# Patient Record
Sex: Male | Born: 1995 | Race: Black or African American | Hispanic: No | Marital: Single
Health system: Southern US, Community
[De-identification: ages and names within clinical notes are randomized; demographics above are authoritative.]

## PROBLEM LIST (undated history)

## (undated) DIAGNOSIS — F840 Autistic disorder: Secondary | ICD-10-CM

## (undated) DIAGNOSIS — T7840XA Allergy, unspecified, initial encounter: Secondary | ICD-10-CM

## (undated) HISTORY — DX: Allergy, unspecified, initial encounter: T78.40XA

## (undated) HISTORY — PX: ORCHIECTOMY: SHX2116

## (undated) HISTORY — PX: BACK SURGERY: SHX140

## (undated) HISTORY — DX: Autistic disorder: F84.0

---
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
## 2017-01-21 ENCOUNTER — Ambulatory Visit: Payer: Self-pay | Admitting: Internal Medicine

## 2017-07-11 ENCOUNTER — Encounter: Payer: Self-pay | Admitting: Internal Medicine

## 2017-07-11 ENCOUNTER — Other Ambulatory Visit (INDEPENDENT_AMBULATORY_CARE_PROVIDER_SITE_OTHER): Payer: Federal, State, Local not specified - PPO

## 2017-07-11 ENCOUNTER — Ambulatory Visit (INDEPENDENT_AMBULATORY_CARE_PROVIDER_SITE_OTHER): Payer: Federal, State, Local not specified - PPO | Admitting: Internal Medicine

## 2017-07-11 VITALS — BP 124/90 | HR 91 | Temp 97.8°F | Ht 70.0 in | Wt 176.0 lb

## 2017-07-11 DIAGNOSIS — Z91013 Allergy to seafood: Secondary | ICD-10-CM

## 2017-07-11 DIAGNOSIS — Z0000 Encounter for general adult medical examination without abnormal findings: Secondary | ICD-10-CM

## 2017-07-11 DIAGNOSIS — Z23 Encounter for immunization: Secondary | ICD-10-CM | POA: Diagnosis not present

## 2017-07-11 DIAGNOSIS — Z91010 Allergy to peanuts: Secondary | ICD-10-CM

## 2017-07-11 LAB — URINALYSIS, ROUTINE W REFLEX MICROSCOPIC
Bilirubin Urine: NEGATIVE
HGB URINE DIPSTICK: NEGATIVE
KETONES UR: NEGATIVE
Leukocytes, UA: NEGATIVE
NITRITE: NEGATIVE
RBC / HPF: NONE SEEN (ref 0–?)
Specific Gravity, Urine: >=1.03 — AB (ref 1.000–1.030)
Total Protein, Urine: 100 — AB
URINE GLUCOSE: NEGATIVE
UROBILINOGEN UA: 1 (ref 0.0–1.0)
pH: 6 (ref 5.0–8.0)

## 2017-07-11 LAB — CBC WITH DIFFERENTIAL/PLATELET
BASOS PCT: 0.2 % (ref 0.0–3.0)
Basophils Absolute: 0 10*3/uL (ref 0.0–0.1)
EOS PCT: 2.6 % (ref 0.0–5.0)
Eosinophils Absolute: 0.1 10*3/uL (ref 0.0–0.7)
HCT: 50.1 % (ref 39.0–52.0)
Hemoglobin: 16.5 g/dL (ref 13.0–17.0)
LYMPHS ABS: 1.6 10*3/uL (ref 0.7–4.0)
Lymphocytes Relative: 32.7 % (ref 12.0–46.0)
MCHC: 33 g/dL (ref 30.0–36.0)
MCV: 82.5 fl (ref 78.0–100.0)
MONO ABS: 0.4 10*3/uL (ref 0.1–1.0)
Monocytes Relative: 8.3 % (ref 3.0–12.0)
NEUTROS ABS: 2.8 10*3/uL (ref 1.4–7.7)
NEUTROS PCT: 56.2 % (ref 43.0–77.0)
PLATELETS: 256 10*3/uL (ref 150.0–400.0)
RBC: 6.08 Mil/uL — ABNORMAL HIGH (ref 4.22–5.81)
RDW: 13.4 % (ref 11.5–14.6)
WBC: 4.9 10*3/uL (ref 4.5–10.5)

## 2017-07-11 LAB — BASIC METABOLIC PANEL
BUN: 18 mg/dL (ref 6–23)
CALCIUM: 10.1 mg/dL (ref 8.4–10.5)
CHLORIDE: 103 meq/L (ref 96–112)
CO2: 26 meq/L (ref 19–32)
CREATININE: 1.31 mg/dL (ref 0.40–1.50)
GFR: 89.18 mL/min (ref >60.00)
Glucose, Bld: 64 mg/dL — ABNORMAL LOW (ref 70–99)
Potassium: 4.4 mEq/L (ref 3.5–5.1)
SODIUM: 139 meq/L (ref 135–145)

## 2017-07-11 LAB — HEPATIC FUNCTION PANEL
ALK PHOS: 47 U/L (ref 39–117)
ALT: 21 U/L (ref 0–53)
AST: 22 U/L (ref 0–37)
Albumin: 4.5 g/dL (ref 3.5–5.2)
BILIRUBIN DIRECT: 0.1 mg/dL (ref 0.0–0.3)
BILIRUBIN TOTAL: 0.7 mg/dL (ref 0.2–1.2)
TOTAL PROTEIN: 7.2 g/dL (ref 6.0–8.3)

## 2017-07-11 LAB — LIPID PANEL
CHOLESTEROL: 117 mg/dL (ref 0–200)
HDL: 42.6 mg/dL (ref >39.00)
LDL CALC: 58 mg/dL (ref 0–99)
NonHDL: 74.24
Total CHOL/HDL Ratio: 3
Triglycerides: 79 mg/dL (ref 0.0–149.0)
VLDL: 15.8 mg/dL (ref 0.0–40.0)

## 2017-07-11 LAB — TSH: TSH: 0.79 u[IU]/mL (ref 0.35–5.50)

## 2017-07-11 LAB — VITAMIN B12: Vitamin B-12: 760 pg/mL (ref 211–911)

## 2017-07-11 MED ORDER — PSEUDOEPHEDRINE HCL 30 MG PO TABS: 60.0000 mg | ORAL_TABLET | ORAL | 1 refills | Status: DC | PRN

## 2017-07-11 MED ORDER — CETIRIZINE HCL 10 MG PO TABS: 10.0000 mg | ORAL_TABLET | Freq: Every day | ORAL | 11 refills | Status: DC

## 2017-07-11 MED ORDER — B COMPLEX PO TABS: 1.0000 | ORAL_TABLET | Freq: Every day | ORAL | 3 refills | Status: AC

## 2017-07-11 MED ORDER — DIPHENHYDRAMINE HCL 25 MG PO TABS: 50.0000 mg | ORAL_TABLET | Freq: Four times a day (QID) | ORAL | 2 refills | Status: AC | PRN

## 2017-07-11 MED ORDER — EPINEPHRINE 0.3 MG/0.3ML IJ SOAJ: 0.3000 mg | Freq: Once | INTRAMUSCULAR | 2 refills | Status: AC

## 2017-07-11 MED ORDER — VITAMIN D3 50 MCG (2000 UT) PO CAPS: 2000.0000 [IU] | ORAL_CAPSULE | Freq: Every day | ORAL | 3 refills | Status: AC

## 2017-07-11 NOTE — Assessment & Plan Note (Signed)
Epipen Benadryl, Sudafed Zyrtec

## 2017-07-11 NOTE — Assessment & Plan Note (Signed)
We discussed age appropriate health related issues, including available/recomended screening tests and vaccinations. We discussed a need for adhering to healthy diet and exercise. Labs were ordered to be later reviewed . All questions were answered.  Flu shot Pt declined tDAP Not driving Glasses

## 2017-07-11 NOTE — Assessment & Plan Note (Signed)
Epipen Benadryl, Sudafed Zyrtec 

## 2017-07-11 NOTE — Progress Notes (Signed)
Subjective:  Patient ID: David Cook, male    DOB: 08/19/1996  Age: 21 y.o. MRN: [id number]  CC: No chief complaint on file.   HPI David Cook presents for a well exam, new patient.  No outpatient prescriptions prior to visit.   No facility-administered medications prior to visit.     ROS Review of Systems  Constitutional: Negative for appetite change, fatigue and unexpected weight change.  HENT: Negative for congestion, nosebleeds, sneezing, sore throat and trouble swallowing.   Eyes: Negative for itching and visual disturbance.  Respiratory: Negative for cough.   Cardiovascular: Negative for chest pain, palpitations and leg swelling.  Gastrointestinal: Negative for abdominal distention, blood in stool, diarrhea and nausea.  Genitourinary: Negative for frequency and hematuria.  Musculoskeletal: Negative for back pain, gait problem, joint swelling and neck pain.  Skin: Negative for rash.  Neurological: Positive for headaches. Negative for dizziness, tremors, speech difficulty and weakness.  Psychiatric/Behavioral: Positive for decreased concentration. Negative for agitation, dysphoric mood and sleep disturbance. The patient is not nervous/anxious.     Objective:  BP 124/90 (BP Location: Left Arm, Patient Position: Sitting, Cuff Size: Normal)   Pulse 91   Temp 97.8 F (36.6 C) (Oral)   Ht 5\' 10"  (1.778 m)   Wt 176 lb (79.8 kg)   SpO2 98%   BMI 25.25 kg/m   BP Readings from Last 3 Encounters:  07/11/17 124/90    Wt Readings from Last 3 Encounters:  07/11/17 176 lb (79.8 kg)    Physical Exam  Constitutional: He is oriented to person, place, and time. He appears well-developed. No distress.  NAD  HENT:  Mouth/Throat: Oropharynx is clear and moist.  Eyes: Pupils are equal, round, and reactive to light. Conjunctivae are normal.  Neck: Normal range of motion. No JVD present. No thyromegaly present.  Cardiovascular: Normal rate, regular rhythm, normal heart  sounds and intact distal pulses.  Exam reveals no gallop and no friction rub.   No murmur heard. Pulmonary/Chest: Effort normal and breath sounds normal. No respiratory distress. He has no wheezes. He has no rales. He exhibits no tenderness.  Abdominal: Soft. Bowel sounds are normal. He exhibits no distension and no mass. There is no tenderness. There is no rebound and no guarding.  Musculoskeletal: Normal range of motion. He exhibits no edema or tenderness.  Lymphadenopathy:    He has no cervical adenopathy.  Neurological: He is alert and oriented to person, place, and time. He has normal reflexes. No cranial nerve deficit. He exhibits normal muscle tone. He displays a negative Romberg sign. Coordination and gait normal.  Skin: Skin is warm and dry. No rash noted.  Psychiatric: He has a normal mood and affect. His behavior is normal. Judgment and thought content normal.    No results found for: WBC, HGB, HCT, PLT, GLUCOSE, CHOL, TRIG, HDL, LDLDIRECT, LDLCALC, ALT, AST, NA, K, CL, CREATININE, BUN, CO2, TSH, PSA, INR, GLUF, HGBA1C, MICROALBUR  Dg Chest 2 View  Result Date: 02/26/2009 Clinical Data: Shortness of breath, wheezing.  CHEST - 2 VIEW  Comparison: None  Findings: Mild hyperaeration of the lungs.  No focal opacities or effusions.  Heart is normal size.  There is gaseous distention of the stomach.  No acute bony abnormality.  IMPRESSION: Mild hyperaeration.  Gaseous distention of the stomach. Provider: Gaspar Skeetersory Sandling   Assessment & Plan:   There are no diagnoses linked to this encounter. Mr. Marissa CalamityBallard does not currently have medications on file.  No orders  of the defined types were placed in this encounter.    Follow-up: No Follow-up on file.  Walker Kehr, MD

## 2017-07-22 NOTE — Addendum Note (Signed)
Addended by: Scarlett Presto on: 07/22/2017 11:56 AM   Modules accepted: Orders

## 2017-11-22 ENCOUNTER — Encounter: Payer: Federal, State, Local not specified - PPO | Admitting: Internal Medicine

## 2017-11-29 ENCOUNTER — Encounter: Payer: Self-pay | Admitting: Internal Medicine

## 2017-11-29 ENCOUNTER — Ambulatory Visit (INDEPENDENT_AMBULATORY_CARE_PROVIDER_SITE_OTHER): Payer: Federal, State, Local not specified - PPO | Admitting: Internal Medicine

## 2017-11-29 VITALS — BP 126/90 | HR 90 | Temp 98.5°F | Ht 70.0 in | Wt 173.0 lb

## 2017-11-29 DIAGNOSIS — Z0000 Encounter for general adult medical examination without abnormal findings: Secondary | ICD-10-CM

## 2017-11-29 DIAGNOSIS — L309 Dermatitis, unspecified: Secondary | ICD-10-CM | POA: Insufficient documentation

## 2017-11-29 DIAGNOSIS — Z23 Encounter for immunization: Secondary | ICD-10-CM

## 2017-11-29 MED ORDER — TRIAMCINOLONE ACETONIDE 0.1 % EX OINT: 1.0000 "application " | TOPICAL_OINTMENT | Freq: Three times a day (TID) | CUTANEOUS | 2 refills | Status: DC

## 2017-11-29 NOTE — Progress Notes (Signed)
Subjective:  Patient ID: David Cook, male    DOB: 04/09/1996  Age: 22 y.o. MRN: [id number]  CC: No chief complaint on file.   HPI David Cook presents for a well exam  Outpatient Medications Prior to Visit  Medication Sig Dispense Refill  . b complex vitamins tablet Take 1 tablet by mouth daily. 100 tablet 3  . cetirizine (ZYRTEC ALLERGY) 10 MG tablet Take 1 tablet (10 mg total) by mouth daily. 30 tablet 11  . Cholecalciferol (VITAMIN D3) 2000 units capsule Take 1 capsule (2,000 Units total) by mouth daily. 100 capsule 3  . diphenhydrAMINE (BENADRYL) 25 MG tablet Take 2 tablets (50 mg total) by mouth every 6 (six) hours as needed for itching or allergies. 60 tablet 2  . pseudoephedrine (SUDAFED) 30 MG tablet Take 2 tablets (60 mg total) by mouth every 4 (four) hours as needed for congestion. 30 tablet 1   No facility-administered medications prior to visit.     ROS Review of Systems  Constitutional: Negative for appetite change, fatigue and unexpected weight change.  HENT: Negative for congestion, nosebleeds, sneezing, sore throat and trouble swallowing.   Eyes: Negative for itching and visual disturbance.  Respiratory: Negative for cough.   Cardiovascular: Negative for chest pain, palpitations and leg swelling.  Gastrointestinal: Negative for abdominal distention, blood in stool, diarrhea and nausea.  Genitourinary: Negative for frequency, hematuria and testicular pain.  Musculoskeletal: Negative for back pain, gait problem, joint swelling and neck pain.  Skin: Positive for rash.  Neurological: Negative for dizziness, tremors, speech difficulty and weakness.  Psychiatric/Behavioral: Negative for agitation, decreased concentration, dysphoric mood, sleep disturbance and suicidal ideas. The patient is not nervous/anxious.     Objective:  BP 126/90 (BP Location: Left Arm, Patient Position: Sitting, Cuff Size: Large)   Pulse 90   Temp 98.5 F (36.9 C) (Oral)   Ht 5'  10" (1.778 m)   Wt 173 lb (78.5 kg)   SpO2 99%   BMI 24.82 kg/m   BP Readings from Last 3 Encounters:  11/29/17 126/90  07/11/17 124/90    Wt Readings from Last 3 Encounters:  11/29/17 173 lb (78.5 kg)  07/11/17 176 lb (79.8 kg)    Physical Exam  Constitutional: He is oriented to person, place, and time. He appears well-developed. No distress.  NAD  HENT:  Mouth/Throat: Oropharynx is clear and moist.  Eyes: Conjunctivae are normal. Pupils are equal, round, and reactive to light.  Neck: Normal range of motion. No JVD present. No thyromegaly present.  Cardiovascular: Normal rate, regular rhythm, normal heart sounds and intact distal pulses. Exam reveals no gallop and no friction rub.  No murmur heard. Pulmonary/Chest: Effort normal and breath sounds normal. No respiratory distress. He has no wheezes. He has no rales. He exhibits no tenderness.  Abdominal: Soft. Bowel sounds are normal. He exhibits no distension and no mass. There is no tenderness. There is no rebound and no guarding.  Musculoskeletal: Normal range of motion. He exhibits no edema or tenderness.  Lymphadenopathy:    He has no cervical adenopathy.  Neurological: He is alert and oriented to person, place, and time. He has normal reflexes. No cranial nerve deficit. He exhibits normal muscle tone. He displays a negative Romberg sign. Coordination and gait normal.  Skin: Skin is warm and dry. No rash noted.  Psychiatric: He has a normal mood and affect. His behavior is normal. Judgment and thought content normal.  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  CHEST - 2 VIEW  Comparison: None  Findings: Mild hyperaeration of the lungs.  No focal opacities or effusions.  Heart is normal size.  There is gaseous distention of the stomach.  No acute bony abnormality.  IMPRESSION: Mild hyperaeration.  Gaseous distention of the stomach. Provider: Gaspar Cook   Assessment & Plan:   There are no diagnoses linked to this encounter. I am having David Cook. David Cook maintain his cetirizine, Vitamin D3, b complex vitamins, pseudoephedrine, and diphenhydrAMINE.  No orders of the defined types were placed in this encounter.    Follow-up: No Follow-up on file.  David Primes, MD

## 2017-11-29 NOTE — Assessment & Plan Note (Signed)
We discussed age appropriate health related issues, including available/recomended screening tests and vaccinations. We discussed a need for adhering to healthy diet and exercise. Labs were ordered to be later reviewed . All questions were answered.    tDAP given Not driving Glasses

## 2017-11-29 NOTE — Assessment & Plan Note (Signed)
Triamc 0.1% oint prn

## 2018-01-09 ENCOUNTER — Ambulatory Visit: Payer: Federal, State, Local not specified - PPO | Admitting: Internal Medicine

## 2018-01-16 ENCOUNTER — Ambulatory Visit: Payer: Federal, State, Local not specified - PPO | Admitting: Internal Medicine

## 2020-05-15 ENCOUNTER — Encounter: Payer: Self-pay | Admitting: Internal Medicine

## 2020-05-15 ENCOUNTER — Other Ambulatory Visit: Payer: Self-pay

## 2020-05-15 ENCOUNTER — Ambulatory Visit (INDEPENDENT_AMBULATORY_CARE_PROVIDER_SITE_OTHER): Payer: Medicare Other | Admitting: Internal Medicine

## 2020-05-15 VITALS — BP 130/82 | HR 89 | Temp 98.9°F | Ht 70.0 in | Wt 203.0 lb

## 2020-05-15 DIAGNOSIS — L309 Dermatitis, unspecified: Secondary | ICD-10-CM

## 2020-05-15 DIAGNOSIS — R202 Paresthesia of skin: Secondary | ICD-10-CM

## 2020-05-15 DIAGNOSIS — Z0000 Encounter for general adult medical examination without abnormal findings: Secondary | ICD-10-CM

## 2020-05-15 DIAGNOSIS — E559 Vitamin D deficiency, unspecified: Secondary | ICD-10-CM

## 2020-05-15 DIAGNOSIS — Z9079 Acquired absence of other genital organ(s): Secondary | ICD-10-CM | POA: Insufficient documentation

## 2020-05-15 DIAGNOSIS — E785 Hyperlipidemia, unspecified: Secondary | ICD-10-CM

## 2020-05-15 DIAGNOSIS — R030 Elevated blood-pressure reading, without diagnosis of hypertension: Secondary | ICD-10-CM | POA: Insufficient documentation

## 2020-05-15 MED ORDER — TRIAMCINOLONE ACETONIDE 0.1 % EX OINT: 1.0000 "application " | TOPICAL_OINTMENT | Freq: Three times a day (TID) | CUTANEOUS | 2 refills | Status: DC

## 2020-05-15 NOTE — Progress Notes (Signed)
Subjective:  Patient ID: David Cook, male    DOB: August 08, 1996  Age: 24 y.o. MRN: [id number]  CC: No chief complaint on file.   HPI PEDRAM GOODCHILD presents for a well exam  Outpatient Medications Prior to Visit  Medication Sig Dispense Refill  . b complex vitamins tablet Take 1 tablet by mouth daily. 100 tablet 3  . Cholecalciferol (VITAMIN D3) 2000 units capsule Take 1 capsule (2,000 Units total) by mouth daily. 100 capsule 3  . diphenhydrAMINE (BENADRYL) 25 MG tablet Take 2 tablets (50 mg total) by mouth every 6 (six) hours as needed for itching or allergies. 60 tablet 2  . pseudoephedrine (SUDAFED) 30 MG tablet Take 2 tablets (60 mg total) by mouth every 4 (four) hours as needed for congestion. 30 tablet 1  . triamcinolone ointment (KENALOG) 0.1 % Apply 1 application topically 3 (three) times daily. Dry skin rash, cracked lips 80 g 2  . cetirizine (ZYRTEC ALLERGY) 10 MG tablet Take 1 tablet (10 mg total) by mouth daily. 30 tablet 11   No facility-administered medications prior to visit.    ROS: Review of Systems  Constitutional: Negative for appetite change, fatigue and unexpected weight change.  HENT: Negative for congestion, nosebleeds, sneezing, sore throat and trouble swallowing.   Eyes: Negative for itching and visual disturbance.  Respiratory: Negative for cough.   Cardiovascular: Negative for chest pain, palpitations and leg swelling.  Gastrointestinal: Negative for abdominal distention, blood in stool, diarrhea and nausea.  Genitourinary: Negative for frequency and hematuria.  Musculoskeletal: Negative for back pain, gait problem, joint swelling and neck pain.  Skin: Negative for rash.  Neurological: Negative for dizziness, tremors, speech difficulty and weakness.  Psychiatric/Behavioral: Negative for agitation, dysphoric mood and sleep disturbance. The patient is not nervous/anxious.   eczema R testis WNL  Objective:  BP (!) 142/100 (BP Location: Left Arm,  Patient Position: Sitting, Cuff Size: Normal)   Pulse 89   Temp 98.9 F (37.2 C) (Oral)   Ht 5\' 10"  (1.778 m)   Wt 203 lb (92.1 kg)   SpO2 97%   BMI 29.13 kg/m   BP Readings from Last 3 Encounters:  05/15/20 (!) 142/100  11/29/17 126/90  07/11/17 124/90    Wt Readings from Last 3 Encounters:  05/15/20 203 lb (92.1 kg)  11/29/17 173 lb (78.5 kg)  07/11/17 176 lb (79.8 kg)    Physical Exam Constitutional:      General: He is not in acute distress.    Appearance: He is well-developed.     Comments: NAD  Eyes:     Conjunctiva/sclera: Conjunctivae normal.     Pupils: Pupils are equal, round, and reactive to light.  Neck:     Thyroid: No thyromegaly.     Vascular: No JVD.  Cardiovascular:     Rate and Rhythm: Normal rate and regular rhythm.     Heart sounds: Normal heart sounds. No murmur heard.  No friction rub. No gallop.   Pulmonary:     Effort: Pulmonary effort is normal. No respiratory distress.     Breath sounds: Normal breath sounds. No wheezing or rales.  Chest:     Chest wall: No tenderness.  Abdominal:     General: Bowel sounds are normal. There is no distension.     Palpations: Abdomen is soft. There is no mass.     Tenderness: There is no abdominal tenderness. There is no guarding or rebound.  Musculoskeletal:  General: No tenderness. Normal range of motion.     Cervical back: Normal range of motion.  Lymphadenopathy:     Cervical: No cervical adenopathy.  Skin:    General: Skin is warm and dry.     Findings: No rash.  Neurological:     Mental Status: He is alert and oriented to person, place, and time.     Cranial Nerves: No cranial nerve deficit.     Motor: No abnormal muscle tone.     Coordination: Coordination normal.     Gait: Gait normal.     Deep Tendon Reflexes: Reflexes are normal and symmetric.  Psychiatric:        Behavior: Behavior normal.        Thought Content: Thought content normal.        Judgment: Judgment normal.    R  testist WNL Partial wax B ears   Lab Results  Component Value Date   WBC 4.9 07/11/2017   HGB 16.5 07/11/2017   HCT 50.1 07/11/2017   PLT 256.0 07/11/2017   GLUCOSE 64 (L) 07/11/2017   CHOL 117 07/11/2017   TRIG 79.0 07/11/2017   HDL 42.60 07/11/2017   LDLCALC 58 07/11/2017   ALT 21 07/11/2017   AST 22 07/11/2017   NA 139 07/11/2017   K 4.4 07/11/2017   CL 103 07/11/2017   CREATININE 1.31 07/11/2017   BUN 18 07/11/2017   CO2 26 07/11/2017   TSH 0.79 07/11/2017    DG Chest 2 View  Result Date: 02/26/2009 Clinical Data: Shortness of breath, wheezing.  CHEST - 2 VIEW  Comparison: None  Findings: Mild hyperaeration of the lungs.  No focal opacities or effusions.  Heart is normal size.  There is gaseous distention of the stomach.  No acute bony abnormality.  IMPRESSION: Mild hyperaeration.  Gaseous distention of the stomach. Provider: Gaspar Skeeters   Assessment & Plan:   There are no diagnoses linked to this encounter.   No orders of the defined types were placed in this encounter.    Follow-up: No follow-ups on file.  Sonda Primes, MD

## 2020-05-15 NOTE — Assessment & Plan Note (Addendum)
Check BP at home Report if high BP Readings from Last 3 Encounters:  05/15/20 (!) 142/100  11/29/17 126/90  07/11/17 124/90

## 2020-05-15 NOTE — Assessment & Plan Note (Signed)
Kenalog cream prn 

## 2020-05-15 NOTE — Assessment & Plan Note (Addendum)
We discussed age appropriate health related issues, including available/recomended screening tests and vaccinations. We discussed a need for adhering to healthy diet and exercise. Labs were ordered. All questions were answered. Clean ears periodically at home w/ewar rinse kit or come here if worse

## 2020-05-19 ENCOUNTER — Encounter: Payer: Self-pay | Admitting: Internal Medicine

## 2021-01-29 ENCOUNTER — Telehealth: Payer: Self-pay | Admitting: Internal Medicine

## 2021-01-29 NOTE — Telephone Encounter (Signed)
[facility] FYI  Caller states her son is experiencing abdominal pain.  Attempted to call 3 times but no VM set up to leave a message.

## 2021-03-25 ENCOUNTER — Ambulatory Visit
Admission: EM | Admit: 2021-03-25 | Discharge: 2021-03-25 | Discharge status: Against Medical Advice | Payer: Medicare Other

## 2021-03-25 ENCOUNTER — Other Ambulatory Visit: Payer: Self-pay

## 2021-03-25 ENCOUNTER — Emergency Department (HOSPITAL_BASED_OUTPATIENT_CLINIC_OR_DEPARTMENT_OTHER): Payer: Medicare Other

## 2021-03-25 ENCOUNTER — Emergency Department (HOSPITAL_BASED_OUTPATIENT_CLINIC_OR_DEPARTMENT_OTHER)
Admission: EM | Admit: 2021-03-25 | Discharge: 2021-03-25 | Disposition: A | Discharge status: Home / Self Care | Payer: Medicare Other | Attending: Emergency Medicine | Admitting: Emergency Medicine

## 2021-03-25 ENCOUNTER — Encounter (HOSPITAL_BASED_OUTPATIENT_CLINIC_OR_DEPARTMENT_OTHER): Payer: Self-pay

## 2021-03-25 ENCOUNTER — Ambulatory Visit: Payer: Medicare Other | Admitting: Internal Medicine

## 2021-03-25 DIAGNOSIS — R5383 Other fatigue: Secondary | ICD-10-CM | POA: Diagnosis not present

## 2021-03-25 DIAGNOSIS — E860 Dehydration: Secondary | ICD-10-CM | POA: Diagnosis not present

## 2021-03-25 DIAGNOSIS — Z91010 Allergy to peanuts: Secondary | ICD-10-CM | POA: Diagnosis not present

## 2021-03-25 DIAGNOSIS — Z20822 Contact with and (suspected) exposure to covid-19: Secondary | ICD-10-CM | POA: Insufficient documentation

## 2021-03-25 DIAGNOSIS — R630 Anorexia: Secondary | ICD-10-CM | POA: Diagnosis not present

## 2021-03-25 DIAGNOSIS — R1030 Lower abdominal pain, unspecified: Secondary | ICD-10-CM | POA: Insufficient documentation

## 2021-03-25 DIAGNOSIS — R509 Fever, unspecified: Secondary | ICD-10-CM | POA: Insufficient documentation

## 2021-03-25 DIAGNOSIS — R519 Headache, unspecified: Secondary | ICD-10-CM | POA: Diagnosis not present

## 2021-03-25 DIAGNOSIS — B349 Viral infection, unspecified: Secondary | ICD-10-CM

## 2021-03-25 LAB — CBC WITH DIFFERENTIAL/PLATELET
Abs Immature Granulocytes: 0.04 10*3/uL (ref 0.00–0.07)
Basophils Absolute: 0 10*3/uL (ref 0.0–0.1)
Basophils Relative: 1 %
Eosinophils Absolute: 0 10*3/uL (ref 0.0–0.5)
Eosinophils Relative: 0 %
HCT: 47.8 % (ref 39.0–52.0)
Hemoglobin: 15.8 g/dL (ref 13.0–17.0)
Immature Granulocytes: 1 %
Lymphocytes Relative: 28 %
Lymphs Abs: 1.1 10*3/uL (ref 0.7–4.0)
MCH: 26.3 pg (ref 26.0–34.0)
MCHC: 33.1 g/dL (ref 30.0–36.0)
MCV: 79.7 fL — ABNORMAL LOW (ref 80.0–100.0)
Monocytes Absolute: 0.3 10*3/uL (ref 0.1–1.0)
Monocytes Relative: 8 %
Neutro Abs: 2.4 10*3/uL (ref 1.7–7.7)
Neutrophils Relative %: 62 %
Platelets: 172 10*3/uL (ref 150–400)
RBC: 6 MIL/uL — ABNORMAL HIGH (ref 4.22–5.81)
RDW: 13.2 % (ref 11.5–15.5)
Smear Review: NORMAL
WBC: 3.8 10*3/uL — ABNORMAL LOW (ref 4.0–10.5)
nRBC: 0 % (ref 0.0–0.2)

## 2021-03-25 LAB — URINALYSIS, MICROSCOPIC (REFLEX)

## 2021-03-25 LAB — COMPREHENSIVE METABOLIC PANEL
ALT: 27 U/L (ref 0–44)
AST: 29 U/L (ref 15–41)
Albumin: 3.7 g/dL (ref 3.5–5.0)
Alkaline Phosphatase: 50 U/L (ref 38–126)
Anion gap: 8 (ref 5–15)
BUN: 17 mg/dL (ref 6–20)
CO2: 23 mmol/L (ref 22–32)
Calcium: 8.7 mg/dL — ABNORMAL LOW (ref 8.9–10.3)
Chloride: 102 mmol/L (ref 98–111)
Creatinine, Ser: 1.63 mg/dL — ABNORMAL HIGH (ref 0.61–1.24)
GFR, Estimated: 60 mL/min — ABNORMAL LOW (ref >60)
Glucose, Bld: 99 mg/dL (ref 70–99)
Potassium: 3.5 mmol/L (ref 3.5–5.1)
Sodium: 133 mmol/L — ABNORMAL LOW (ref 135–145)
Total Bilirubin: 0.6 mg/dL (ref 0.3–1.2)
Total Protein: 7.3 g/dL (ref 6.5–8.1)

## 2021-03-25 LAB — RESP PANEL BY RT-PCR (FLU A&B, COVID) ARPGX2
Influenza A by PCR: NEGATIVE
Influenza B by PCR: NEGATIVE
SARS Coronavirus 2 by RT PCR: NEGATIVE

## 2021-03-25 LAB — URINALYSIS, ROUTINE W REFLEX MICROSCOPIC
Bilirubin Urine: NEGATIVE
Glucose, UA: NEGATIVE mg/dL
Ketones, ur: 15 mg/dL — AB
Leukocytes,Ua: NEGATIVE
Nitrite: NEGATIVE
Protein, ur: >300 mg/dL — AB
Specific Gravity, Urine: >1.03 — ABNORMAL HIGH (ref 1.005–1.030)
pH: 6 (ref 5.0–8.0)

## 2021-03-25 MED ORDER — ACETAMINOPHEN 325 MG PO TABS
650.0000 mg | ORAL_TABLET | Freq: Once | ORAL | Status: AC
  Administered 2021-03-25: 650 mg via ORAL
  Filled 2021-03-25: qty 2

## 2021-03-25 MED ORDER — KETOROLAC TROMETHAMINE 15 MG/ML IJ SOLN: 15.0000 mg | Freq: Once | INTRAMUSCULAR | Status: DC

## 2021-03-25 MED ORDER — LACTATED RINGERS IV BOLUS
1000.0000 mL | Freq: Once | INTRAVENOUS | Status: AC
  Administered 2021-03-25: 1000 mL via INTRAVENOUS

## 2021-03-25 MED ORDER — SODIUM CHLORIDE 0.9 % IV BOLUS
1000.0000 mL | Freq: Once | INTRAVENOUS | Status: AC
  Administered 2021-03-25: 1000 mL via INTRAVENOUS

## 2021-03-25 MED ORDER — HYDROMORPHONE HCL 1 MG/ML IJ SOLN: 1.0000 mg | Freq: Once | INTRAMUSCULAR | Status: DC | PRN

## 2021-03-25 NOTE — ED Provider Notes (Signed)
[facility] EMERGENCY DEPARTMENT Provider Note   CSN: [id number] Arrival date & time: 03/25/21  1238     History Chief Complaint  Patient presents with  . Fatigue    David RIEGLER is a 25 y.o. male.  HPI Patient presents feeling bad for around 3 days.  History of autism.  Reportedly having fevers.  For the last 3 days has been more sleepy.  Headache.  Also had been complaining of lower abdominal pain.  No nausea or vomiting.  Has had a decreased appetite.  No known sick contacts.  Is vaccinated for COVID.  History also comes from the mother including the patient.  No cough.  No sore throat.  Headache is dull. Was previous premature baby.  Past Medical History:  Diagnosis Date  . Allergy    seafood, peanuts  . Autism     Patient Active Problem List   Diagnosis Date Noted  . 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 Elevated blood pressure reading 05/15/2020  . Eczema 11/29/2017  . Well adult exam 07/11/2017  . Allergic to peanuts 07/11/2017  . 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 BACK SURGERY    . ORCHIECTOMY Left    very remote       Family History  Problem Relation Age of Onset  . Depression Mother   . Heart disease Paternal Grandmother   . Stroke Paternal Grandmother     Social History   Tobacco Use  . Smoking status: Never Smoker  . Smokeless tobacco: Never Used  Substance Use Topics  . Alcohol use: No  . Drug use: No    Home Medications Prior to Admission medications   Medication Sig Start Date End Date Taking? Authorizing Provider  b complex vitamins tablet Take 1 tablet by mouth daily. 07/11/17   Plotnikov, Georgina Quint, MD  cetirizine (ZYRTEC ALLERGY) 10 MG tablet Take 1 tablet (10 mg total) by mouth daily. 07/11/17 07/11/18  Plotnikov, Georgina Quint, MD  Cholecalciferol (VITAMIN D3) 2000 units capsule Take 1 capsule (2,000 Units total) by mouth daily. 07/11/17   Plotnikov, Georgina Quint, MD   diphenhydrAMINE (BENADRYL) 25 MG tablet Take 2 tablets (50 mg total) by mouth every 6 (six) hours as needed for itching or allergies. 07/11/17   Plotnikov, Georgina Quint, MD  pseudoephedrine (SUDAFED) 30 MG tablet Take 2 tablets (60 mg total) by mouth every 4 (four) hours as needed for congestion. 07/11/17   Plotnikov, Georgina Quint, MD  triamcinolone ointment (KENALOG) 0.1 % Apply 1 application topically 3 (three) times daily. Dry skin rash, cracked lips 05/15/20   Plotnikov, Georgina Quint, MD    Allergies    Other  Review of Systems   Review of Systems  Constitutional: Positive for appetite change, fatigue and fever.  HENT: Negative for congestion.   Respiratory: Negative for shortness of breath.   Gastrointestinal: Positive for abdominal pain.  Genitourinary: Negative for dysuria.  Musculoskeletal: Negative for back pain.  Skin: Negative for rash.  Neurological: Positive for headaches.  Psychiatric/Behavioral: Positive for confusion.    Physical Exam Updated Vital Signs BP 139/86 (BP Location: Left Arm)   Pulse 91   Temp (!) 100.9 F (38.3 C) (Oral)   Resp 15   Ht 6' (1.829 m)   Wt 83.9 kg   SpO2 100%   BMI 25.09 kg/m   Physical Exam Vitals and nursing note reviewed.  HENT:     Head: Normocephalic.  Eyes:  General: No scleral icterus.    Pupils: Pupils are equal, round, and reactive to light.  Cardiovascular:     Rate and Rhythm: Normal rate and regular rhythm.  Pulmonary:     Breath sounds: No wheezing or rhonchi.  Chest:     Chest wall: No tenderness.  Abdominal:     Comments: Mild suprapubic tenderness without rebound or guarding.  No hernia palpated.  Musculoskeletal:        General: No tenderness.     Cervical back: Neck supple.  Skin:    General: Skin is warm.     Capillary Refill: Capillary refill takes less than 2 seconds.  Neurological:     Mental Status: He is alert.     Comments: Patient sitting in bed.  Will answer questions.  Able to tell me his head  hurts.  Says his abdomen does not hurt now but had previously hurt.  Moves all extremities.  Still prefers to sit with his eyes closed however.     ED Results / Procedures / Treatments   Labs (all labs ordered are listed, but only abnormal results are displayed) Labs Reviewed  COMPREHENSIVE METABOLIC PANEL - Abnormal; Notable for the following components:      Result Value   Sodium 133 (*)    Creatinine, Ser 1.63 (*)    Calcium 8.7 (*)    GFR, Estimated 60 (*)    All other components within normal limits  CBC WITH DIFFERENTIAL/PLATELET - Abnormal; Notable for the following components:   WBC 3.8 (*)    RBC 6.00 (*)    MCV 79.7 (*)    All other components within normal limits  URINALYSIS, ROUTINE W REFLEX MICROSCOPIC - Abnormal; Notable for the following components:   Specific Gravity, Urine >1.030 (*)    Hgb urine dipstick TRACE (*)    Ketones, ur 15 (*)    Protein, ur >300 (*)    All other components within normal limits  URINALYSIS, MICROSCOPIC (REFLEX) - Abnormal; Notable for the following components:   Bacteria, UA FEW (*)    All other components within normal limits  RESP PANEL BY RT-PCR (FLU A&B, COVID) [id number]    EKG None  Radiology DG Chest Portable 1 View  Result Date: 03/25/2021 CLINICAL DATA:  Fever EXAM: PORTABLE CHEST 1 VIEW COMPARISON:  02/26/2009 FINDINGS: The heart size and mediastinal contours are within normal limits. Both lungs are clear. The visualized skeletal structures are unremarkable. IMPRESSION: No acute abnormality of the lungs in AP portable projection. Electronically Signed   By: Lauralyn Primes M.D.   On: 03/25/2021 14:00    Procedures Procedures   Medications Ordered in ED Medications  sodium chloride 0.9 % bolus 1,000 mL (1,000 mLs Intravenous New Bag/Given 03/25/21 1442)  acetaminophen (TYLENOL) tablet 650 mg (650 mg Oral Given 03/25/21 1442)    ED Course  I have reviewed the triage vital signs and the nursing notes.  Pertinent labs &  imaging results that were available during my care of the patient were reviewed by me and considered in my medical decision making (see chart for details).    MDM Rules/Calculators/A&P                          Patient with fever and fatigue.  No known sick contacts.  Has slightly low white count.  Creatinine mildly elevated at 1.6 but appears the last 1 I can see was around 4 years ago and was  1.3.  Chest x-ray reassuring.  Negative COVID and flu testing.  Will give fluid bolus.  Urine shows some dehydration but not clear infection.  Will get CT of the head and abdomen pelvis since he did have the abdominal pain.  I think not likely meningitis at this time.  Care will be turned over to Dr. Particia Nearing Final Clinical Impression(s) / ED Diagnoses Final diagnoses:  Fever, unspecified fever cause    Rx / DC Orders ED Discharge Orders    None       Benjiman Core, MD 03/25/21 [id number]

## 2021-03-25 NOTE — ED Notes (Signed)
ED Provider at bedside. 

## 2021-03-25 NOTE — Discharge Instructions (Addendum)
You are seen in the ER for fatigue, low-grade fever.  Work-up in the ER does not reveal any evidence of bladder infection, pneumonia, COVID-19 or flu.  CT scan of the head and abdomen and pelvis are also reassuring.  We suspect that you might have a viral illness.  Please take Tylenol for fever control and hydrate well.  Return to the ER if your symptoms get worse.

## 2021-03-25 NOTE — ED Triage Notes (Addendum)
Per mother/pt has autism-pt is verbal and answers my questions appropriately-pt with lethargy, sleeping a lot, HA x 3 days-pt to triage in w/c-NAD

## 2021-05-21 ENCOUNTER — Ambulatory Visit: Payer: Medicare Other | Admitting: Internal Medicine

## 2022-04-05 ENCOUNTER — Other Ambulatory Visit: Payer: Self-pay

## 2022-04-05 ENCOUNTER — Emergency Department (HOSPITAL_BASED_OUTPATIENT_CLINIC_OR_DEPARTMENT_OTHER)
Admission: EM | Admit: 2022-04-05 | Discharge: 2022-04-05 | Disposition: A | Discharge status: Home / Self Care | Payer: Medicare Other | Attending: Student | Admitting: Student

## 2022-04-05 ENCOUNTER — Encounter (HOSPITAL_BASED_OUTPATIENT_CLINIC_OR_DEPARTMENT_OTHER): Payer: Self-pay | Admitting: *Deleted

## 2022-04-05 DIAGNOSIS — T7840XA Allergy, unspecified, initial encounter: Secondary | ICD-10-CM | POA: Diagnosis not present

## 2022-04-05 DIAGNOSIS — R1084 Generalized abdominal pain: Secondary | ICD-10-CM | POA: Insufficient documentation

## 2022-04-05 DIAGNOSIS — F840 Autistic disorder: Secondary | ICD-10-CM | POA: Diagnosis not present

## 2022-04-05 DIAGNOSIS — R220 Localized swelling, mass and lump, head: Secondary | ICD-10-CM | POA: Diagnosis not present

## 2022-04-05 DIAGNOSIS — Z91010 Allergy to peanuts: Secondary | ICD-10-CM | POA: Diagnosis not present

## 2022-04-05 MED ORDER — EPINEPHRINE 0.3 MG/0.3ML IJ SOAJ: 0.3000 mg | INTRAMUSCULAR | 0 refills | Status: DC | PRN

## 2022-04-05 MED ORDER — EPINEPHRINE 0.3 MG/0.3ML IJ SOAJ
0.3000 mg | Freq: Once | INTRAMUSCULAR | Status: AC
  Administered 2022-04-05: 0.3 mg via INTRAMUSCULAR
  Filled 2022-04-05: qty 0.3

## 2022-04-05 NOTE — ED Triage Notes (Signed)
Ate pasta/ noodle around 9am, mother had scallops in the pasta, per mother states her sone is allergic to shell fish and peanuts. At approx 10am had vomiting episode, and some lip swelling, mother gave 2 Benadryl PO, pt states his throat feels tight and itching

## 2022-04-05 NOTE — ED Provider Notes (Signed)
Blood pressure 126/79, pulse 86, temperature 98.6 F (37 C), resp. rate 14, height 6' (1.829 m), weight 93.4 kg, SpO2 98 %.  Assuming care from Dr. Posey Rea.  In short, David Cook is a 26 y.o. male with a chief complaint of Allergic Reaction .  Refer to the original H&P for additional details.  The current plan of care is to f/u after ED observation time.  05:35 PM  Patient reevaluated and looking well.  Swelling significantly decreased per mom at bedside.  Will refill be prescription for EpiPen. Discussed strict ED return precautions.     Maia Plan, MD 04/05/22 [phone]

## 2022-04-05 NOTE — Discharge Instructions (Signed)

## 2022-04-05 NOTE — ED Provider Notes (Signed)
[facility] EMERGENCY DEPARTMENT Provider Note  CSN: [id number] Arrival date & time: 04/05/22 1341  Chief Complaint(s) Allergic Reaction  HPI David Cook is a 26 y.o. male with PMH autism, seafood allergy who presents the emergency department for evaluation of an allergic reaction.  History obtained from patient's mother and patient who states that he had Posta that was cooked in a similar pan with scallops.  The patient is complaining of a "scratchy throat", swollen lips and abdominal pain.  Mother administered 50 of Benadryl prior to arrival.  She states that the patient's EpiPen's are expired and thus she did not administer these.  On arrival, patient is speaking in full sentences with no appreciable respiratory distress, no visible rash.   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 Authorizing Provider  b complex vitamins tablet Take 1 tablet by mouth daily. 07/11/17   Plotnikov, Georgina Quint, MD  cetirizine (ZYRTEC ALLERGY) 10 MG tablet Take 1 tablet (10 mg total) by mouth daily. 07/11/17 07/11/18  Plotnikov, Georgina Quint, MD  Cholecalciferol (VITAMIN D3) 2000 units capsule Take 1 capsule (2,000 Units total) by mouth daily. 07/11/17   Plotnikov, Georgina Quint, MD  diphenhydrAMINE (BENADRYL) 25 MG tablet Take 2 tablets (50 mg total) by mouth every 6 (six) hours as needed for itching or allergies. 07/11/17   Plotnikov, Georgina Quint, MD  pseudoephedrine (SUDAFED) 30 MG tablet Take 2 tablets (60 mg total) by mouth every 4 (four) hours as needed for congestion. 07/11/17   Plotnikov, Georgina Quint, MD   triamcinolone ointment (KENALOG) 0.1 % Apply 1 application topically 3 (three) times daily. Dry skin rash, cracked lips 05/15/20   Plotnikov, Georgina Quint, MD                                                                                                                                    Past Surgical History Past Surgical History:  Procedure Laterality Date   BACK SURGERY     ORCHIECTOMY Left    very remote   Family History Family History  Problem Relation Age of Onset   Depression Mother    Heart disease Paternal Grandmother    Stroke Paternal Grandmother     Social History Social History   Tobacco Use   Smoking status: Never   Smokeless tobacco: Never  Substance Use Topics   Alcohol use: No   Drug use: No   Allergies Other  Review of Systems Review of Systems  HENT:  Positive  for facial swelling.   Gastrointestinal:  Positive for abdominal pain.   Physical Exam Vital Signs  I have reviewed the triage vital signs BP 126/79   Pulse 86   Temp 98.6 F (37 C)   Resp 14   Ht 6' (1.829 m)   Wt 93.4 kg   SpO2 98%   BMI 27.94 kg/m   Physical Exam Constitutional:      General: He is not in acute distress.    Appearance: Normal appearance.  HENT:     Head: Normocephalic and atraumatic.     Comments: Mild lip swelling, no appreciable oropharyngeal swelling    Nose: No congestion or rhinorrhea.  Eyes:     General:        Right eye: No discharge.        Left eye: No discharge.     Extraocular Movements: Extraocular movements intact.     Pupils: Pupils are equal, round, and reactive to light.  Cardiovascular:     Rate and Rhythm: Normal rate and regular rhythm.     Heart sounds: No murmur heard. Pulmonary:     Effort: No respiratory distress.     Breath sounds: No wheezing or rales.  Abdominal:     General: There is no distension.     Tenderness: There is abdominal tenderness.  Musculoskeletal:        General: Normal range of motion.     Cervical back:  Normal range of motion.  Skin:    General: Skin is warm and dry.  Neurological:     General: No focal deficit present.     Mental Status: He is alert.    ED Results and Treatments Labs (all labs ordered are listed, but only abnormal results are displayed) Labs Reviewed - No data to display                                                                                                                        Radiology No results found.  Pertinent labs & imaging results that were available during my care of the patient were reviewed by me and considered in my medical decision making (see MDM for details).  Medications Ordered in ED Medications  EPINEPHrine (EPI-PEN) injection 0.3 mg (0.3 mg Intramuscular Given 04/05/22 1433)  Procedures .Critical Care Performed by: Glendora Score, MD Authorized by: Glendora Score, MD   Critical care provider statement:    Critical care time (minutes):  30   Critical care was necessary to treat or prevent imminent or life-threatening deterioration of the following conditions: anaphylaxis.   Critical care was time spent personally by me on the following activities:  Development of treatment plan with patient or surrogate, discussions with consultants, evaluation of patient's response to treatment, examination of patient, ordering and review of laboratory studies, ordering and review of radiographic studies, ordering and performing treatments and interventions, pulse oximetry, re-evaluation of patient's condition and review of old charts  (including critical care time)  Medical Decision Making / ED Course   This patient presents to the ED for concern of allergic reaction, this involves an extensive number of treatment options, and is a complaint that carries with it a high risk of complications and morbidity.  The  differential diagnosis includes allergic reaction, anaphylaxis, aspiration  MDM: Patient seen in the emergency room for evaluation of allergic reaction.  Physical exam reveals lip swelling and mild generalized abdominal tenderness but no appreciable urticaria or oropharyngeal swelling.  However, given multisystem involvement and persistent symptoms despite 50 Benadryl, patient was given an EpiPen 0.3 mg here in the emergency department.  On reevaluation, symptoms are improving but patient will require a 3-hour observation here in the emergency department.  Patient will be safe for discharge at 5:30 PM.  Patient then signed out to oncoming provider.  Please see provider signout for continuation of work-up.  Anticipate discharge home   Additional history obtained: -Additional history obtained from mother -External records from outside source obtained and reviewed including: Chart review including previous notes, labs, imaging, consultation notes  Medicines ordered and prescription drug management: Meds ordered this encounter  Medications   EPINEPHrine (EPI-PEN) injection 0.3 mg    -I have reviewed the patients home medicines and have made adjustments as needed  Critical interventions Epipen administration     Cardiac Monitoring: The patient was maintained on a cardiac monitor.  I personally viewed and interpreted the cardiac monitored which showed an underlying rhythm of: NSR  Social Determinants of Health:  Factors impacting patients care include: none   Reevaluation: After the interventions noted above, I reevaluated the patient and found that they have :improved  Co morbidities that complicate the patient evaluation  Past Medical History:  Diagnosis Date   Allergy    seafood, peanuts   Autism       Dispostion: I considered admission for this patient, and disposition will be pending reevaluation after 3-hour observation.  Anticipate discharge.     Final Clinical  Impression(s) / ED Diagnoses Final diagnoses:  None     @PCDICTATION @    , MD 04/05/22 1605

## 2022-04-05 NOTE — ED Notes (Signed)
No body rash is currently noted, speech wnl, able to swallow without difficulty, controls secretions. Tracheal sounds clear, BS clear bilaterally, resp status WNL

## 2022-04-15 ENCOUNTER — Ambulatory Visit (INDEPENDENT_AMBULATORY_CARE_PROVIDER_SITE_OTHER): Payer: Medicare Other | Admitting: Internal Medicine

## 2022-04-15 ENCOUNTER — Encounter: Payer: Self-pay | Admitting: Internal Medicine

## 2022-04-15 VITALS — BP 128/98 | HR 85 | Temp 97.9°F | Ht 72.0 in | Wt 211.0 lb

## 2022-04-15 DIAGNOSIS — Z91010 Allergy to peanuts: Secondary | ICD-10-CM | POA: Diagnosis not present

## 2022-04-15 DIAGNOSIS — E785 Hyperlipidemia, unspecified: Secondary | ICD-10-CM

## 2022-04-15 DIAGNOSIS — Z0000 Encounter for general adult medical examination without abnormal findings: Secondary | ICD-10-CM

## 2022-04-15 DIAGNOSIS — Z91013 Allergy to seafood: Secondary | ICD-10-CM

## 2022-04-15 DIAGNOSIS — J301 Allergic rhinitis due to pollen: Secondary | ICD-10-CM

## 2022-04-15 DIAGNOSIS — L309 Dermatitis, unspecified: Secondary | ICD-10-CM

## 2022-04-15 DIAGNOSIS — J309 Allergic rhinitis, unspecified: Secondary | ICD-10-CM | POA: Insufficient documentation

## 2022-04-15 DIAGNOSIS — R030 Elevated blood-pressure reading, without diagnosis of hypertension: Secondary | ICD-10-CM

## 2022-04-15 MED ORDER — CLARITIN-D 24 HOUR 10-240 MG PO TB24: 1.0000 | ORAL_TABLET | Freq: Every day | ORAL | 5 refills | Status: AC

## 2022-04-15 MED ORDER — TRIAMCINOLONE ACETONIDE 0.1 % EX OINT: 1.0000 | TOPICAL_OINTMENT | Freq: Three times a day (TID) | CUTANEOUS | 2 refills | Status: DC

## 2022-04-15 MED ORDER — PREDNISONE 20 MG PO TABS: 40.0000 mg | ORAL_TABLET | Freq: Every day | ORAL | 1 refills | Status: DC | PRN

## 2022-04-15 NOTE — Assessment & Plan Note (Signed)
We discussed age appropriate health related issues, including available/recomended screening tests and vaccinations. We discussed a need for adhering to healthy diet and exercise. Labs were ordered. All questions were answered. 

## 2022-04-15 NOTE — Progress Notes (Signed)
Subjective:  Patient ID: David Cook, male    DOB: Oct 23, 1996  Age: 26 y.o. MRN: [id number]  CC: No chief complaint on file.   HPI David Cook presents for a well exam C/o food allergies Playing games - Supersmash  Outpatient Medications Prior to Visit  Medication Sig Dispense Refill  . b complex vitamins tablet Take 1 tablet by mouth daily. 100 tablet 3  . Cholecalciferol (VITAMIN D3) 2000 units capsule Take 1 capsule (2,000 Units total) by mouth daily. 100 capsule 3  . diphenhydrAMINE (BENADRYL) 25 MG tablet Take 2 tablets (50 mg total) by mouth every 6 (six) hours as needed for itching or allergies. 60 tablet 2  . EPINEPHrine 0.3 mg/0.3 mL IJ SOAJ injection Inject 0.3 mg into the muscle as needed for anaphylaxis. 1 each 0  . pseudoephedrine (SUDAFED) 30 MG tablet Take 2 tablets (60 mg total) by mouth every 4 (four) hours as needed for congestion. 30 tablet 1  . triamcinolone ointment (KENALOG) 0.1 % Apply 1 application topically 3 (three) times daily. Dry skin rash, cracked lips 80 g 2  . cetirizine (ZYRTEC ALLERGY) 10 MG tablet Take 1 tablet (10 mg total) by mouth daily. 30 tablet 11   No facility-administered medications prior to visit.    ROS: Review of Systems  Constitutional:  Negative for appetite change, fatigue and unexpected weight change.  HENT:  Positive for congestion and rhinorrhea. Negative for nosebleeds, sneezing, sore throat and trouble swallowing.   Eyes:  Negative for itching and visual disturbance.  Respiratory:  Negative for cough.   Cardiovascular:  Negative for chest pain, palpitations and leg swelling.  Gastrointestinal:  Negative for abdominal distention, blood in stool, diarrhea and nausea.  Genitourinary:  Negative for frequency and hematuria.  Musculoskeletal:  Negative for back pain, gait problem, joint swelling and neck pain.  Skin:  Positive for rash.  Neurological:  Negative for dizziness, tremors, speech difficulty and weakness.   Psychiatric/Behavioral:  Negative for agitation, dysphoric mood and sleep disturbance. The patient is not nervous/anxious.     Objective:  BP (!) 128/98 (BP Location: Left Arm, Patient Position: Sitting, Cuff Size: Normal)   Pulse 85   Temp 97.9 F (36.6 C) (Oral)   Ht 6' (1.829 m)   Wt 211 lb (95.7 kg)   SpO2 97%   BMI 28.62 kg/m   BP Readings from Last 3 Encounters:  04/15/22 (!) 128/98  04/05/22 (!) 147/99  03/25/21 (!) 143/90    Wt Readings from Last 3 Encounters:  04/15/22 211 lb (95.7 kg)  04/05/22 206 lb (93.4 kg)  03/25/21 185 lb (83.9 kg)    Physical Exam Constitutional:      General: He is not in acute distress.    Appearance: He is well-developed.     Comments: NAD  Eyes:     Conjunctiva/sclera: Conjunctivae normal.     Pupils: Pupils are equal, round, and reactive to light.  Neck:     Thyroid: No thyromegaly.     Vascular: No JVD.  Cardiovascular:     Rate and Rhythm: Normal rate and regular rhythm.     Heart sounds: Normal heart sounds. No murmur heard.    No friction rub. No gallop.  Pulmonary:     Effort: Pulmonary effort is normal. No respiratory distress.     Breath sounds: Normal breath sounds. No wheezing or rales.  Chest:     Chest wall: No tenderness.  Abdominal:     General: Bowel sounds  are normal. There is no distension.     Palpations: Abdomen is soft. There is no mass.     Tenderness: There is no abdominal tenderness. There is no guarding or rebound.  Musculoskeletal:        General: No tenderness. Normal range of motion.     Cervical back: Normal range of motion.  Lymphadenopathy:     Cervical: No cervical adenopathy.  Skin:    General: Skin is warm and dry.     Findings: No rash.  Neurological:     Mental Status: He is alert and oriented to person, place, and time.     Cranial Nerves: No cranial nerve deficit.     Motor: No abnormal muscle tone.     Coordination: Coordination normal.     Gait: Gait normal.     Deep Tendon  Reflexes: Reflexes are normal and symmetric.  Psychiatric:        Behavior: Behavior normal.        Thought Content: Thought content normal.        Judgment: Judgment normal.    Lab Results  Component Value Date   WBC 3.8 (L) 03/25/2021   HGB 15.8 03/25/2021   HCT 47.8 03/25/2021   PLT 172 03/25/2021   GLUCOSE 99 03/25/2021   CHOL 117 07/11/2017   TRIG 79.0 07/11/2017   HDL 42.60 07/11/2017   LDLCALC 58 07/11/2017   ALT 27 03/25/2021   AST 29 03/25/2021   NA 133 (L) 03/25/2021   K 3.5 03/25/2021   CL 102 03/25/2021   CREATININE 1.63 (H) 03/25/2021   BUN 17 03/25/2021   CO2 23 03/25/2021   TSH 0.79 07/11/2017    No results found.  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    Epipen, Benadryl, Sudafed prn Cont on Claritin D 24 h daily Allergy ref      Relevant Orders   Ambulatory referral to Allergy   TSH   Urinalysis   CBC with Differential/Platelet   Lipid panel   Comprehensive metabolic panel   Eczema    Kenalog cream prn      Relevant Orders   Ambulatory referral to Allergy   TSH   Urinalysis   CBC with Differential/Platelet   Lipid panel   Comprehensive metabolic panel   Elevated BP without diagnosis of hypertension    Cut back on salt - NAS diet Consider low dose Losartan      Well adult exam - Primary    We discussed age appropriate health related issues, including available/recomended screening tests and vaccinations. We discussed a need for adhering to healthy diet and exercise. Labs were ordered. All questions were answered.       Relevant Orders   TSH   Urinalysis   CBC with Differential/Platelet   Lipid panel   Comprehensive metabolic  panel   Other Visit Diagnoses     Dyslipidemia       Relevant Orders   TSH   Lipid panel         Meds ordered this encounter  Medications  . triamcinolone ointment (  KENALOG) 0.1 %    Sig: Apply 1 Application topically 3 (three) times daily. Dry skin rash, cracked lips    Dispense:  80 g    Refill:  2  . predniSONE (DELTASONE) 20 MG tablet    Sig: Take 2 tablets (40 mg total) by mouth daily as needed.    Dispense:  10 tablet    Refill:  1  . loratadine-pseudoephedrine (CLARITIN-D 24 HOUR) 10-240 MG 24 hr tablet    Sig: Take 1 tablet by mouth daily.    Dispense:  30 tablet    Refill:  5      Follow-up: Return in about 3 months (around 07/16/2022) for a follow-up visit.  Sonda Primes, MD

## 2022-04-15 NOTE — Assessment & Plan Note (Signed)
Kenalog cream prn 

## 2022-04-15 NOTE — Assessment & Plan Note (Addendum)
For an allergic reaction use Epipen, Benadryl, Prednisone 40 mg prn Cont on Claritin D 24 h daily Allergy ref

## 2022-04-15 NOTE — Assessment & Plan Note (Signed)
On Claritin D 24 h Allergy ref

## 2022-04-15 NOTE — Patient Instructions (Signed)
For an allergic reaction use Epipen, Benadryl, Prednisone 40 mg as needed

## 2022-04-15 NOTE — Assessment & Plan Note (Addendum)
Epipen, Benadryl, Sudafed prn Cont on Claritin D 24 h daily Allergy ref

## 2022-04-15 NOTE — Assessment & Plan Note (Signed)
Cut back on salt - NAS diet Consider low dose Losartan

## 2022-08-24 ENCOUNTER — Ambulatory Visit: Payer: Medicare Other | Admitting: Internal Medicine

## 2022-09-08 ENCOUNTER — Encounter: Payer: Self-pay | Admitting: Internal Medicine

## 2022-09-08 ENCOUNTER — Ambulatory Visit (INDEPENDENT_AMBULATORY_CARE_PROVIDER_SITE_OTHER): Payer: Medicare Other | Admitting: Internal Medicine

## 2022-09-08 VITALS — BP 120/86 | HR 81 | Temp 98.0°F | Ht 72.0 in | Wt 209.8 lb

## 2022-09-08 DIAGNOSIS — R151 Fecal smearing: Secondary | ICD-10-CM | POA: Diagnosis not present

## 2022-09-08 DIAGNOSIS — L290 Pruritus ani: Secondary | ICD-10-CM | POA: Diagnosis not present

## 2022-09-08 DIAGNOSIS — R159 Full incontinence of feces: Secondary | ICD-10-CM | POA: Insufficient documentation

## 2022-09-08 MED ORDER — TRIAMCINOLONE ACETONIDE 0.1 % EX OINT: 1.0000 | TOPICAL_OINTMENT | Freq: Two times a day (BID) | CUTANEOUS | 2 refills | Status: DC

## 2022-09-08 MED ORDER — LACTAID 3000 UNITS PO TABS: 6000.0000 [IU] | ORAL_TABLET | Freq: Three times a day (TID) | ORAL | 3 refills | Status: AC | PRN

## 2022-09-08 NOTE — Assessment & Plan Note (Signed)
Stool leakage - mild ?lactose intolerance Cut back on milk products, pizzas

## 2022-09-08 NOTE — Progress Notes (Signed)
Subjective:  Patient ID: David Cook, male    DOB: 1996/03/08  Age: 26 y.o. MRN: [id number]  CC: Rash (Possible jock itch)   HPI David Cook presents for itching in the rear since   Outpatient Medications Prior to Visit  Medication Sig Dispense Refill   b complex vitamins tablet Take 1 tablet by mouth daily. 100 tablet 3   Cholecalciferol (VITAMIN D3) 2000 units capsule Take 1 capsule (2,000 Units total) by mouth daily. 100 capsule 3   diphenhydrAMINE (BENADRYL) 25 MG tablet Take 2 tablets (50 mg total) by mouth every 6 (six) hours as needed for itching or allergies. 60 tablet 2   EPINEPHrine 0.3 mg/0.3 mL IJ SOAJ injection Inject 0.3 mg into the muscle as needed for anaphylaxis. 1 each 0   loratadine-pseudoephedrine (CLARITIN-D 24 HOUR) 10-240 MG 24 hr tablet Take 1 tablet by mouth daily. 30 tablet 5   predniSONE (DELTASONE) 20 MG tablet Take 2 tablets (40 mg total) by mouth daily as needed. 10 tablet 1   triamcinolone ointment (KENALOG) 0.1 % Apply 1 Application topically 3 (three) times daily. Dry skin rash, cracked lips 80 g 2   No facility-administered medications prior to visit.    ROS: Review of Systems  Constitutional:  Negative for appetite change, fatigue and unexpected weight change.  HENT:  Negative for congestion, nosebleeds, sneezing, sore throat and trouble swallowing.   Eyes:  Negative for itching and visual disturbance.  Respiratory:  Negative for cough.   Cardiovascular:  Negative for chest pain, palpitations and leg swelling.  Gastrointestinal:  Positive for diarrhea. Negative for abdominal distention, abdominal pain, anal bleeding, blood in stool, nausea, rectal pain and vomiting.  Genitourinary:  Negative for frequency and hematuria.  Musculoskeletal:  Negative for back pain, gait problem, joint swelling and neck pain.  Skin:  Negative for rash.  Neurological:  Negative for dizziness, tremors, speech difficulty and weakness.  Psychiatric/Behavioral:   Negative for agitation, dysphoric mood and sleep disturbance. The patient is not nervous/anxious.     Objective:  BP 120/86 (BP Location: Left Arm)   Pulse 81   Temp 98 F (36.7 C) (Oral)   Ht 6' (1.829 m)   Wt 209 lb 12.8 oz (95.2 kg)   SpO2 97%   BMI 28.45 kg/m   BP Readings from Last 3 Encounters:  09/08/22 120/86  04/15/22 (!) 128/98  04/05/22 (!) 147/99    Wt Readings from Last 3 Encounters:  09/08/22 209 lb 12.8 oz (95.2 kg)  04/15/22 211 lb (95.7 kg)  04/05/22 206 lb (93.4 kg)    Physical Exam Constitutional:      General: He is not in acute distress.    Appearance: He is well-developed. He is obese.     Comments: NAD  Eyes:     Conjunctiva/sclera: Conjunctivae normal.     Pupils: Pupils are equal, round, and reactive to light.  Neck:     Thyroid: No thyromegaly.     Vascular: No JVD.  Cardiovascular:     Rate and Rhythm: Normal rate and regular rhythm.     Heart sounds: Normal heart sounds. No murmur heard.    No friction rub. No gallop.  Pulmonary:     Effort: Pulmonary effort is normal. No respiratory distress.     Breath sounds: Normal breath sounds. No wheezing or rales.  Chest:     Chest wall: No tenderness.  Abdominal:     General: Bowel sounds are normal. There is no distension.  Palpations: Abdomen is soft. There is no mass.     Tenderness: There is no abdominal tenderness. There is no guarding or rebound.  Musculoskeletal:        General: No tenderness. Normal range of motion.     Cervical back: Normal range of motion.  Lymphadenopathy:     Cervical: No cervical adenopathy.  Skin:    General: Skin is warm and dry.     Findings: No rash.  Neurological:     Mental Status: He is alert and oriented to person, place, and time.     Cranial Nerves: No cranial nerve deficit.     Motor: No abnormal muscle tone.     Coordination: Coordination normal.     Gait: Gait normal.     Deep Tendon Reflexes: Reflexes are normal and symmetric.   Psychiatric:        Behavior: Behavior normal.        Thought Content: Thought content normal.        Judgment: Judgment normal.   Anal area w/loose stool smears   Lab Results  Component Value Date   WBC 3.8 (L) 03/25/2021   HGB 15.8 03/25/2021   HCT 47.8 03/25/2021   PLT 172 03/25/2021   GLUCOSE 99 03/25/2021   CHOL 117 07/11/2017   TRIG 79.0 07/11/2017   HDL 42.60 07/11/2017   LDLCALC 58 07/11/2017   ALT 27 03/25/2021   AST 29 03/25/2021   NA 133 (L) 03/25/2021   K 3.5 03/25/2021   CL 102 03/25/2021   CREATININE 1.63 (H) 03/25/2021   BUN 17 03/25/2021   CO2 23 03/25/2021   TSH 0.79 07/11/2017    No results found.  Assessment & Plan:   Problem List Items Addressed This Visit     Anal itching - Primary    Hygiene discussed Wash after BMs and pat dry Triamcinolone cream bid prn      Stool incontinence    Stool leakage - mild ?lactose intolerance Cut back on milk products, pizzas         Meds ordered this encounter  Medications   lactase (LACTAID) 3000 units tablet    Sig: Take 2 tablets (6,000 Units total) by mouth 3 (three) times daily with meals as needed (when eating cheese, pizza).    Dispense:  120 tablet    Refill:  3   triamcinolone ointment (KENALOG) 0.1 %    Sig: Apply 1 Application topically 2 (two) times daily. Dry skin rash, cracked lips    Dispense:  80 g    Refill:  2      Follow-up: No follow-ups on file.  Sonda Primes, MD

## 2022-09-08 NOTE — Patient Instructions (Addendum)
  Cut back on milk products, pizzas  Wash after BMs and pat dry

## 2022-09-08 NOTE — Assessment & Plan Note (Signed)
Hygiene discussed Wash after BMs and pat dry Triamcinolone cream bid prn

## 2022-12-29 ENCOUNTER — Ambulatory Visit: Payer: 59

## 2023-05-19 ENCOUNTER — Ambulatory Visit: Payer: 59 | Admitting: Internal Medicine

## 2023-05-25 ENCOUNTER — Encounter: Payer: Self-pay | Admitting: Internal Medicine

## 2023-05-25 ENCOUNTER — Ambulatory Visit (INDEPENDENT_AMBULATORY_CARE_PROVIDER_SITE_OTHER): Payer: 59 | Admitting: Internal Medicine

## 2023-05-25 VITALS — BP 130/80 | HR 102 | Temp 98.9°F | Ht 72.0 in | Wt 203.0 lb

## 2023-05-25 DIAGNOSIS — Z91013 Allergy to seafood: Secondary | ICD-10-CM

## 2023-05-25 DIAGNOSIS — H6120 Impacted cerumen, unspecified ear: Secondary | ICD-10-CM

## 2023-05-25 DIAGNOSIS — L309 Dermatitis, unspecified: Secondary | ICD-10-CM | POA: Diagnosis not present

## 2023-05-25 DIAGNOSIS — Z91010 Allergy to peanuts: Secondary | ICD-10-CM

## 2023-05-25 MED ORDER — CLOBETASOL PROPIONATE 0.05 % EX SOLN: 1.0000 | Freq: Two times a day (BID) | CUTANEOUS | 3 refills | Status: DC

## 2023-05-25 MED ORDER — CLOBETASOL PROPIONATE 0.05 % EX CREA: 1.0000 | TOPICAL_CREAM | Freq: Two times a day (BID) | CUTANEOUS | 2 refills | Status: DC

## 2023-05-25 NOTE — Assessment & Plan Note (Signed)
Use Debrox kit

## 2023-05-25 NOTE — Progress Notes (Signed)
Subjective:  Patient ID: David Cook, male    DOB: September 18, 1996  Age: 27 y.o. MRN: [id number]  CC: Eczema (Dry, flaky skin on ears, scalp, and facial hair... Sore on foot due to eczema)   HPI David Cook presents for rash - feet, face, scalp  Outpatient Medications Prior to Visit  Medication Sig Dispense Refill   b complex vitamins tablet Take 1 tablet by mouth daily. 100 tablet 3   Cholecalciferol (VITAMIN D3) 2000 units capsule Take 1 capsule (2,000 Units total) by mouth daily. 100 capsule 3   diphenhydrAMINE (BENADRYL) 25 MG tablet Take 2 tablets (50 mg total) by mouth every 6 (six) hours as needed for itching or allergies. 60 tablet 2   EPINEPHrine 0.3 mg/0.3 mL IJ SOAJ injection Inject 0.3 mg into the muscle as needed for anaphylaxis. 1 each 0   lactase (LACTAID) 3000 units tablet Take 2 tablets (6,000 Units total) by mouth 3 (three) times daily with meals as needed (when eating cheese, pizza). 120 tablet 3   loratadine-pseudoephedrine (CLARITIN-D 24 HOUR) 10-240 MG 24 hr tablet Take 1 tablet by mouth daily. 30 tablet 5   predniSONE (DELTASONE) 20 MG tablet Take 2 tablets (40 mg total) by mouth daily as needed. 10 tablet 1   triamcinolone ointment (KENALOG) 0.1 % Apply 1 Application topically 2 (two) times daily. Dry skin rash, cracked lips 80 g 2   No facility-administered medications prior to visit.    ROS: Review of Systems  Constitutional:  Negative for appetite change, fatigue and unexpected weight change.  HENT:  Negative for congestion, nosebleeds, sneezing, sore throat and trouble swallowing.   Eyes:  Negative for itching and visual disturbance.  Respiratory:  Negative for cough.   Cardiovascular:  Negative for chest pain, palpitations and leg swelling.  Gastrointestinal:  Negative for abdominal distention, blood in stool, diarrhea and nausea.  Genitourinary:  Negative for frequency and hematuria.  Musculoskeletal:  Negative for back pain, gait problem, joint  swelling and neck pain.  Skin:  Positive for rash.  Neurological:  Negative for dizziness, tremors, speech difficulty and weakness.  Psychiatric/Behavioral:  Negative for agitation, decreased concentration, dysphoric mood and sleep disturbance. The patient is nervous/anxious.     Objective:  BP 130/80 (BP Location: Right Arm, Patient Position: Sitting, Cuff Size: Normal)   Pulse (!) 102   Temp 98.9 F (37.2 C) (Oral)   Ht 6' (1.829 m)   Wt 203 lb (92.1 kg)   SpO2 96%   BMI 27.53 kg/m   BP Readings from Last 3 Encounters:  05/25/23 130/80  09/08/22 120/86  04/15/22 (!) 128/98    Wt Readings from Last 3 Encounters:  05/25/23 203 lb (92.1 kg)  09/08/22 209 lb 12.8 oz (95.2 kg)  04/15/22 211 lb (95.7 kg)    Physical Exam Constitutional:      General: He is not in acute distress.    Appearance: Normal appearance. He is well-developed.     Comments: NAD  Eyes:     Conjunctiva/sclera: Conjunctivae normal.     Pupils: Pupils are equal, round, and reactive to light.  Neck:     Thyroid: No thyromegaly.     Vascular: No JVD.  Cardiovascular:     Rate and Rhythm: Normal rate and regular rhythm.     Heart sounds: Normal heart sounds. No murmur heard.    No friction rub. No gallop.  Pulmonary:     Effort: Pulmonary effort is normal. No respiratory distress.  Breath sounds: Normal breath sounds. No wheezing or rales.  Chest:     Chest wall: No tenderness.  Abdominal:     General: Bowel sounds are normal. There is no distension.     Palpations: Abdomen is soft. There is no mass.     Tenderness: There is no abdominal tenderness. There is no guarding or rebound.  Musculoskeletal:        General: No tenderness. Normal range of motion.     Cervical back: Normal range of motion.  Lymphadenopathy:     Cervical: No cervical adenopathy.  Skin:    General: Skin is warm and dry.     Findings: No rash.  Neurological:     Mental Status: He is alert and oriented to person, place,  and time.     Cranial Nerves: No cranial nerve deficit.     Motor: No abnormal muscle tone.     Coordination: Coordination normal.     Gait: Gait normal.     Deep Tendon Reflexes: Reflexes are normal and symmetric.  Psychiatric:        Behavior: Behavior normal.        Thought Content: Thought content normal.    Seborrheic dermatitis is present on the scalp and facial skin folds Lab Results  Component Value Date   WBC 3.8 (L) 03/25/2021   HGB 15.8 03/25/2021   HCT 47.8 03/25/2021   PLT 172 03/25/2021   GLUCOSE 99 03/25/2021   CHOL 117 07/11/2017   TRIG 79.0 07/11/2017   HDL 42.60 07/11/2017   LDLCALC 58 07/11/2017   ALT 27 03/25/2021   AST 29 03/25/2021   NA 133 (L) 03/25/2021   K 3.5 03/25/2021   CL 102 03/25/2021   CREATININE 1.63 (H) 03/25/2021   BUN 17 03/25/2021   CO2 23 03/25/2021   TSH 0.79 07/11/2017    No results found.  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    Dispense:  50 mL    Refill:  3   clobetasol cream (TEMOVATE) 0.05 %    Sig: Apply 1 Application topically 2 (two) times daily.    Dispense:  120 g    Refill:  2      Follow-up: Return in about 3 months (around 08/25/2023) for a follow-up visit.  Sonda Primes, MD

## 2023-05-25 NOTE — Patient Instructions (Signed)
Use Debrox kit

## 2023-05-25 NOTE — Assessment & Plan Note (Signed)
Clobetasol sol Clobetasol cream

## 2023-06-01 ENCOUNTER — Encounter: Payer: Self-pay | Admitting: Internal Medicine

## 2023-06-01 NOTE — Assessment & Plan Note (Signed)
For an allergic reaction use Epipen, Benadryl, Prednisone 40 mg prn Cont on Claritin D 24 h daily Allergy ref

## 2023-06-01 NOTE — Assessment & Plan Note (Signed)
Epipen, Benadryl, Sudafed prn Cont on Claritin D 24 h daily Allergy ref

## 2024-01-12 DIAGNOSIS — H5213 Myopia, bilateral: Secondary | ICD-10-CM | POA: Diagnosis not present

## 2024-01-12 DIAGNOSIS — H52223 Regular astigmatism, bilateral: Secondary | ICD-10-CM | POA: Diagnosis not present

## 2024-01-31 ENCOUNTER — Ambulatory Visit: Payer: Self-pay

## 2024-01-31 NOTE — Telephone Encounter (Signed)
 Chief Complaint: dizziness Symptoms: numbness and weakness in bilateral legs, dizziness, constipation Frequency: x 20 minute episode today, resolved Pertinent Negatives: Patient denies nausea, vomiting, chest pain, cough, fever, bleeding, LOC Disposition: [] ED /[] Urgent Care (no appt availability in office) / [x] Appointment(In office/virtual)/ []  [facility]/ [] Home Care/ [] Refused Recommended Disposition /[]  Mobile Bus/ []  Follow-up with PCP Additional Notes: Patient was working out at [employer] today with his aunt and a Systems analyst. While doing push ups patient became lightheaded and was not able to stand up due to bilateral leg weakness and numbness. Patient sat down and did not complete the work out. He was able to ambulate after resting and felt lightheaded for about 20 minute duration. Patient, aunt and grandmother on the phone for triage. Agreeable to acute visit tomorrow with PCP. Grandmother verbalizes understanding to call back for new or worsening symptoms.  Copied from CRM [id number]. Topic: Clinical - Red Word Triage >> Jan 31, 2024  3:16 PM [place] E wrote: [facility] that prompted transfer to Nurse Triage: Patient's grandma on line stating that the patient lost control of his arms and legs today at a workout class. Grandma stated that the patient had to sit down until he regained feeling. Reason for Disposition  [1] MODERATE dizziness (e.g., interferes with normal activities) AND [2] has been evaluated by doctor (or NP/PA) for this  Answer Assessment - Initial Assessment Questions 1. DESCRIPTION: "Describe your dizziness."     Patient states it has resolved now at this time. Lightheaded, faint/woozy like he couldn't stand up.  2. LIGHTHEADED: "Do you feel lightheaded?" (e.g., somewhat faint, woozy, weak upon standing)     Not at this time, has resolved.  3. VERTIGO: "Do you feel like either you or the room is spinning or tilting?" (i.e. vertigo)      Denies.  4. SEVERITY: "How bad is it?"  "Do you feel like you are going to faint?" "Can you stand and walk?"   - MILD: Feels slightly dizzy, but walking normally.   - MODERATE: Feels unsteady when walking, but not falling; interferes with normal activities (e.g., school, work).   - SEVERE: Unable to walk without falling, or requires assistance to walk without falling; feels like passing out now.      Denies.  5. ONSET:  "When did the dizziness begin?"     Today around 11:45 am , lasted about 20 minutes and is now resolved.  6. AGGRAVATING FACTORS: "Does anything make it worse?" (e.g., standing, change in head position)     Exercise brought it on.  7. HEART RATE: "Can you tell me your heart rate?" "How many beats in 15 seconds?"  (Note: not all patients can do this)       Denies.  8. CAUSE: "What do you think is causing the dizziness?"     Patient was at the gym this morning around 11:45 and he was exercising with his aunt and a Systems analyst. The patient was doing push ups and suddenly he had dizziness with his legs going weak and numb. Patient sat out the rest of the work out. Patient still felt dizzy for about 20 minutes after but his arms and legs had full function. His aunt states she thought he was dehydrated. Patient reports he drinks at least 8 bottle of water daily.  9. RECURRENT SYMPTOM: "Have you had dizziness before?" If Yes, ask: "When was the last time?" "What happened that time?"     Denies.  10.  OTHER SYMPTOMS: "Do you have any other symptoms?" (e.g., fever, chest pain, vomiting, diarrhea, bleeding)       Denies.  11. PREGNANCY: "Is there any chance you are pregnant?" "When was your last menstrual period?"       N/A.  Protocols used: Dizziness - Lightheadedness-A-AH

## 2024-02-01 ENCOUNTER — Encounter: Payer: Self-pay | Admitting: Internal Medicine

## 2024-02-01 ENCOUNTER — Ambulatory Visit (INDEPENDENT_AMBULATORY_CARE_PROVIDER_SITE_OTHER): Admitting: Internal Medicine

## 2024-02-01 VITALS — BP 142/86 | HR 96 | Temp 97.8°F | Ht 72.0 in | Wt 214.8 lb

## 2024-02-01 DIAGNOSIS — R55 Syncope and collapse: Secondary | ICD-10-CM | POA: Insufficient documentation

## 2024-02-01 DIAGNOSIS — R030 Elevated blood-pressure reading, without diagnosis of hypertension: Secondary | ICD-10-CM | POA: Diagnosis not present

## 2024-02-01 LAB — CBC WITH DIFFERENTIAL/PLATELET
Basophils Absolute: 0 10*3/uL (ref 0.0–0.1)
Basophils Relative: 0.4 % (ref 0.0–3.0)
Eosinophils Absolute: 0.2 10*3/uL (ref 0.0–0.7)
Eosinophils Relative: 4.1 % (ref 0.0–5.0)
HCT: 43.7 % (ref 39.0–52.0)
Hemoglobin: 14.4 g/dL (ref 13.0–17.0)
Lymphocytes Relative: 35.8 % (ref 12.0–46.0)
Lymphs Abs: 1.8 10*3/uL (ref 0.7–4.0)
MCHC: 33 g/dL (ref 30.0–36.0)
MCV: 79.1 fl (ref 78.0–100.0)
Monocytes Absolute: 0.4 10*3/uL (ref 0.1–1.0)
Monocytes Relative: 8.5 % (ref 3.0–12.0)
Neutro Abs: 2.6 10*3/uL (ref 1.4–7.7)
Neutrophils Relative %: 51.2 % (ref 43.0–77.0)
Platelets: 268 10*3/uL (ref 150.0–400.0)
RBC: 5.52 Mil/uL (ref 4.22–5.81)
RDW: 13.7 % (ref 11.5–15.5)
WBC: 5.1 10*3/uL (ref 4.0–10.5)

## 2024-02-01 LAB — VITAMIN B12: Vitamin B-12: 697 pg/mL (ref 211–911)

## 2024-02-01 LAB — COMPREHENSIVE METABOLIC PANEL WITH GFR
ALT: 18 U/L (ref 0–53)
AST: 20 U/L (ref 0–37)
Albumin: 3.9 g/dL (ref 3.5–5.2)
Alkaline Phosphatase: 48 U/L (ref 39–117)
BUN: 24 mg/dL — ABNORMAL HIGH (ref 6–23)
CO2: 22 meq/L (ref 19–32)
Calcium: 8.9 mg/dL (ref 8.4–10.5)
Chloride: 109 meq/L (ref 96–112)
Creatinine, Ser: 1.59 mg/dL — ABNORMAL HIGH (ref 0.40–1.50)
GFR: 59.27 mL/min — ABNORMAL LOW (ref >60.00)
Glucose, Bld: 92 mg/dL (ref 70–99)
Potassium: 3.8 meq/L (ref 3.5–5.1)
Sodium: 139 meq/L (ref 135–145)
Total Bilirubin: 0.5 mg/dL (ref 0.2–1.2)
Total Protein: 6.6 g/dL (ref 6.0–8.3)

## 2024-02-01 LAB — URINALYSIS, ROUTINE W REFLEX MICROSCOPIC
Bilirubin Urine: NEGATIVE
Hgb urine dipstick: NEGATIVE
Ketones, ur: NEGATIVE
Leukocytes,Ua: NEGATIVE
Nitrite: NEGATIVE
RBC / HPF: NONE SEEN (ref 0–?)
Specific Gravity, Urine: >=1.03 — AB (ref 1.000–1.030)
Total Protein, Urine: >=300 — AB
Urine Glucose: NEGATIVE
Urobilinogen, UA: 0.2 (ref 0.0–1.0)
pH: 6 (ref 5.0–8.0)

## 2024-02-01 LAB — VITAMIN D 25 HYDROXY (VIT D DEFICIENCY, FRACTURES): VITD: 16.99 ng/mL — ABNORMAL LOW (ref 30.00–100.00)

## 2024-02-01 LAB — TSH: TSH: 1.8 u[IU]/mL (ref 0.35–5.50)

## 2024-02-01 NOTE — Assessment & Plan Note (Addendum)
 Likely vaso-vagal Reduce exercise effiort x 3-5 days EKG CBC, CMET, TSH and other tests Hydrate well

## 2024-02-01 NOTE — Assessment & Plan Note (Addendum)
 Monitor BP RTC 6 wks

## 2024-02-01 NOTE — Progress Notes (Signed)
 Subjective:  Patient ID: David Cook, male    DOB: 1996/10/28  Age: 28 y.o. MRN: [id number]  CC:   HPI LINDBERG ZENON presents for Dizziness (Patient had bout of lightheadedness during workout session. Patient had total weakness in the body and could not get up from the floor for about 10-20 minutes. Informed that there was nothing else out of the ordinary for the exercise. It was 11 am on 01/31/24. Feeling normal today. He had some upset stomach sx's yesterday He is here w/aunt  Outpatient Medications Prior to Visit  Medication Sig Dispense Refill   b complex vitamins tablet Take 1 tablet by mouth daily. 100 tablet 3   Cholecalciferol (VITAMIN D3) 2000 units capsule Take 1 capsule (2,000 Units total) by mouth daily. 100 capsule 3   clobetasol (TEMOVATE) 0.05 % external solution Apply 1 Application topically 2 (two) times daily. 50 mL 3   clobetasol cream (TEMOVATE) 0.05 % Apply 1 Application topically 2 (two) times daily. 120 g 2   diphenhydrAMINE (BENADRYL) 25 MG tablet Take 2 tablets (50 mg total) by mouth every 6 (six) hours as needed for itching or allergies. 60 tablet 2   EPINEPHrine 0.3 mg/0.3 mL IJ SOAJ injection Inject 0.3 mg into the muscle as needed for anaphylaxis. 1 each 0   lactase (LACTAID) 3000 units tablet Take 2 tablets (6,000 Units total) by mouth 3 (three) times daily with meals as needed (when eating cheese, pizza). 120 tablet 3   loratadine-pseudoephedrine (CLARITIN-D 24 HOUR) 10-240 MG 24 hr tablet Take 1 tablet by mouth daily. 30 tablet 5   predniSONE (DELTASONE) 20 MG tablet Take 2 tablets (40 mg total) by mouth daily as needed. 10 tablet 1   No facility-administered medications prior to visit.    ROS: Review of Systems  Constitutional:  Negative for appetite change, fatigue and unexpected weight change.  HENT:  Negative for congestion, nosebleeds, sneezing, sore throat and trouble swallowing.   Eyes:  Negative for itching and visual disturbance.   Respiratory:  Negative for cough.   Cardiovascular:  Negative for chest pain, palpitations and leg swelling.  Gastrointestinal:  Negative for abdominal distention, blood in stool, diarrhea and nausea.  Genitourinary:  Negative for frequency and hematuria.  Musculoskeletal:  Negative for back pain, gait problem, joint swelling and neck pain.  Skin:  Negative for rash.  Neurological:  Positive for light-headedness. Negative for dizziness, tremors, syncope, speech difficulty and weakness.  Psychiatric/Behavioral:  Negative for agitation, dysphoric mood and sleep disturbance. The patient is not nervous/anxious.     Objective:  BP (!) 142/86   Pulse 96   Temp 97.8 F (36.6 C)   Ht 6' (1.829 m)   Wt 214 lb 12.8 oz (97.4 kg)   SpO2 96%   BMI 29.13 kg/m   BP Readings from Last 3 Encounters:  02/01/24 (!) 142/86  05/25/23 130/80  09/08/22 120/86    Wt Readings from Last 3 Encounters:  02/01/24 214 lb 12.8 oz (97.4 kg)  05/25/23 203 lb (92.1 kg)  09/08/22 209 lb 12.8 oz (95.2 kg)    Physical Exam Constitutional:      General: He is not in acute distress.    Appearance: He is well-developed.     Comments: NAD  Eyes:     Conjunctiva/sclera: Conjunctivae normal.     Pupils: Pupils are equal, round, and reactive to light.  Neck:     Thyroid: No thyromegaly.     Vascular: No JVD.  Cardiovascular:  Rate and Rhythm: Normal rate and regular rhythm.     Heart sounds: Normal heart sounds. No murmur heard.    No friction rub. No gallop.  Pulmonary:     Effort: Pulmonary effort is normal. No respiratory distress.     Breath sounds: Normal breath sounds. No wheezing or rales.  Chest:     Chest wall: No tenderness.  Abdominal:     General: Bowel sounds are normal. There is no distension.     Palpations: Abdomen is soft. There is no mass.     Tenderness: There is no abdominal tenderness. There is no guarding or rebound.  Musculoskeletal:        General: No tenderness. Normal  range of motion.     Cervical back: Normal range of motion.  Lymphadenopathy:     Cervical: No cervical adenopathy.  Skin:    General: Skin is warm and dry.     Findings: No rash.  Neurological:     Mental Status: He is alert and oriented to person, place, and time. Mental status is at baseline.     Cranial Nerves: No cranial nerve deficit.     Motor: No abnormal muscle tone.     Coordination: Coordination normal.     Gait: Gait normal.     Deep Tendon Reflexes: Reflexes are normal and symmetric.  Psychiatric:        Behavior: Behavior normal.        Thought Content: Thought content normal.   Not orthostatic  Procedure: EKG Indication: near-syncope Impression: NSR. No acute changes.  Lab Results  Component Value Date   WBC 3.8 (L) 03/25/2021   HGB 15.8 03/25/2021   HCT 47.8 03/25/2021   PLT 172 03/25/2021   GLUCOSE 99 03/25/2021   CHOL 117 07/11/2017   TRIG 79.0 07/11/2017   HDL 42.60 07/11/2017   LDLCALC 58 07/11/2017   ALT 27 03/25/2021   AST 29 03/25/2021   NA 133 (L) 03/25/2021   K 3.5 03/25/2021   CL 102 03/25/2021   CREATININE 1.63 (H) 03/25/2021   BUN 17 03/25/2021   CO2 23 03/25/2021   TSH 0.79 07/11/2017    No results found.  Assessment & Plan:   Problem List Items Addressed This Visit     Elevated BP without diagnosis of hypertension   Monitor BP RTC 6 wks      Relevant Orders   TSH   Urinalysis   CBC with Differential/Platelet   Comprehensive metabolic panel with GFR   Vitamin B12   VITAMIN D 25 Hydroxy (Vit-D Deficiency, Fractures)   Near syncope - Primary   Likely vaso-vagal Reduce exercise effiort x 3-5 days EKG CBC, CMET, TSH and other tests Hydrate well      Relevant Orders   TSH   Urinalysis   CBC with Differential/Platelet   Comprehensive metabolic panel with GFR   Vitamin B12   VITAMIN D 25 Hydroxy (Vit-D Deficiency, Fractures)   EKG 12-Lead      No orders of the defined types were placed in this encounter.      Follow-up: Return in about 6 weeks (around 03/14/2024) for a follow-up visit.  Sonda Primes, MD

## 2024-02-02 ENCOUNTER — Other Ambulatory Visit: Payer: Self-pay | Admitting: Internal Medicine

## 2024-02-02 DIAGNOSIS — E559 Vitamin D deficiency, unspecified: Secondary | ICD-10-CM

## 2024-02-02 DIAGNOSIS — R030 Elevated blood-pressure reading, without diagnosis of hypertension: Secondary | ICD-10-CM

## 2024-02-13 ENCOUNTER — Telehealth: Payer: Self-pay | Admitting: Internal Medicine

## 2024-02-13 NOTE — Telephone Encounter (Signed)
 Copied from CRM [id number]. Topic: Clinical - Medication Question >> Feb 13, 2024 12:07 PM Adonis Hoot wrote: Reason for CRM: Patients mom called in stating that the prescription for 50000 units wasn't called in to pharmacy .   [id number] [place], [place] - [address]  Phone: [phone] Fax: [phone]

## 2024-02-21 ENCOUNTER — Other Ambulatory Visit (INDEPENDENT_AMBULATORY_CARE_PROVIDER_SITE_OTHER)

## 2024-02-21 ENCOUNTER — Ambulatory Visit (INDEPENDENT_AMBULATORY_CARE_PROVIDER_SITE_OTHER)

## 2024-02-21 VITALS — BP 120/60 | HR 72 | Ht 70.0 in | Wt 210.0 lb

## 2024-02-21 DIAGNOSIS — Z0000 Encounter for general adult medical examination without abnormal findings: Secondary | ICD-10-CM

## 2024-02-21 DIAGNOSIS — R030 Elevated blood-pressure reading, without diagnosis of hypertension: Secondary | ICD-10-CM

## 2024-02-21 DIAGNOSIS — Z1159 Encounter for screening for other viral diseases: Secondary | ICD-10-CM

## 2024-02-21 DIAGNOSIS — Z114 Encounter for screening for human immunodeficiency virus [HIV]: Secondary | ICD-10-CM

## 2024-02-21 DIAGNOSIS — E559 Vitamin D deficiency, unspecified: Secondary | ICD-10-CM | POA: Diagnosis not present

## 2024-02-21 LAB — MICROALBUMIN / CREATININE URINE RATIO
Creatinine,U: 190.5 mg/dL
Microalb Creat Ratio: 980.2 mg/g — ABNORMAL HIGH (ref 0.0–30.0)
Microalb, Ur: 186.7 mg/dL — ABNORMAL HIGH (ref 0.0–1.9)

## 2024-02-21 LAB — VITAMIN D 25 HYDROXY (VIT D DEFICIENCY, FRACTURES): VITD: 24.67 ng/mL — ABNORMAL LOW (ref 30.00–100.00)

## 2024-02-21 LAB — COMPREHENSIVE METABOLIC PANEL WITH GFR
ALT: 17 U/L (ref 0–53)
AST: 19 U/L (ref 0–37)
Albumin: 4.4 g/dL (ref 3.5–5.2)
Alkaline Phosphatase: 54 U/L (ref 39–117)
BUN: 17 mg/dL (ref 6–23)
CO2: 30 meq/L (ref 19–32)
Calcium: 9.7 mg/dL (ref 8.4–10.5)
Chloride: 104 meq/L (ref 96–112)
Creatinine, Ser: 1.64 mg/dL — ABNORMAL HIGH (ref 0.40–1.50)
GFR: 57.09 mL/min — ABNORMAL LOW (ref >60.00)
Glucose, Bld: 92 mg/dL (ref 70–99)
Potassium: 4 meq/L (ref 3.5–5.1)
Sodium: 139 meq/L (ref 135–145)
Total Bilirubin: 0.6 mg/dL (ref 0.2–1.2)
Total Protein: 7.5 g/dL (ref 6.0–8.3)

## 2024-02-21 MED ORDER — VITAMIN D (ERGOCALCIFEROL) 1.25 MG (50000 UNIT) PO CAPS
50000.0000 [IU] | ORAL_CAPSULE | ORAL | 0 refills | Status: AC
Start: 2024-02-21 — End: ?

## 2024-02-21 NOTE — Patient Instructions (Addendum)
 David Cook , Thank you for taking time to come for your Medicare Wellness Visit. I appreciate your ongoing commitment to your health goals. Please review the following plan we discussed and let me know if I can assist you in the future.   Referrals/Orders/Follow-Ups/Clinician Recommendations: Aim for 30 minutes of exercise or brisk walking, 6-8 glasses of water, and 5 servings of fruits and vegetables each day.   This is a list of the screening recommended for you and due dates:  Health Maintenance  Topic Date Due   HIV Screening  Never done   Hepatitis C Screening  Never done   COVID-19 Vaccine (1 - 2024-25 season) Never done   Flu Shot  06/01/2024   Medicare Annual Wellness Visit  02/20/2025   DTaP/Tdap/Td vaccine (2 - Td or Tdap) 11/30/2027   HPV Vaccine  Aged Out   Meningitis B Vaccine  Aged Out    Advanced directives: (Provided) Advance directive discussed with you today. I have provided a copy for you to complete at home and have notarized. Once this is complete, please bring a copy in to our office so we can scan it into your chart.   Next Medicare Annual Wellness Visit scheduled for next year: Yes

## 2024-02-21 NOTE — Telephone Encounter (Signed)
 I am sorry.  I resent [facility] to [facility].  Thank you

## 2024-02-21 NOTE — Progress Notes (Addendum)
 Subjective:   David Cook is a 28 y.o. who presents for a Medicare Wellness preventive visit.  Visit Complete: In person  Persons Participating in Visit: Patient.  AWV Questionnaire: No: Patient Medicare AWV questionnaire was not completed prior to this visit.  Cardiac Risk Factors include: advanced age (>79men, >25 women);male gender     Objective:    Today's Vitals   02/21/24 1318  BP: 120/60  Pulse: 72  SpO2: 98%  Weight: 210 lb (95.3 kg)  Height: 5\' 10"  (1.778 m)   Body mass index is 30.13 kg/m.     02/21/2024    1:17 PM 03/25/2021   12:47 PM  Advanced Directives  Does Patient Have a Medical Advance Directive? No No  Would patient like information on creating a medical advance directive? Yes (MAU/Ambulatory/Procedural Areas - Information given)     Current Medications (verified) Outpatient Encounter Medications as of 02/21/2024  Medication Sig   b complex vitamins tablet Take 1 tablet by mouth daily.   Cholecalciferol (VITAMIN D3) 2000 units capsule Take 1 capsule (2,000 Units total) by mouth daily.   clobetasol  (TEMOVATE ) 0.05 % external solution Apply 1 Application topically 2 (two) times daily.   clobetasol  cream (TEMOVATE ) 0.05 % Apply 1 Application topically 2 (two) times daily.   diphenhydrAMINE  (BENADRYL ) 25 MG tablet Take 2 tablets (50 mg total) by mouth every 6 (six) hours as needed for itching or allergies.   EPINEPHrine  0.3 mg/0.3 mL IJ SOAJ injection Inject 0.3 mg into the muscle as needed for anaphylaxis.   lactase (LACTAID) 3000 units tablet Take 2 tablets (6,000 Units total) by mouth 3 (three) times daily with meals as needed (when eating cheese, pizza).   loratadine -pseudoephedrine  (CLARITIN -D 24 HOUR) 10-240 MG 24 hr tablet Take 1 tablet by mouth daily.   Vitamin D , Ergocalciferol , (DRISDOL ) 1.25 MG (50000 UNIT) CAPS capsule Take 1 capsule (50,000 Units total) by mouth every 7 (seven) days.   No facility-administered encounter medications on  file as of 02/21/2024.    Allergies (verified) Other   History: Past Medical History:  Diagnosis Date   Allergy    seafood, peanuts   Autism    Past Surgical History:  Procedure Laterality Date   BACK SURGERY     ORCHIECTOMY Left    very remote   Family History  Problem Relation Age of Onset   Depression Mother    Heart disease Paternal Grandmother    Stroke Paternal Grandmother    Social History   Socioeconomic History   Marital status: Single    Spouse name: Not on file   Number of children: Not on file   Years of education: Not on file   Highest education level: Not on file  Occupational History   Not on file  Tobacco Use   Smoking status: Never    Passive exposure: Never   Smokeless tobacco: Never  Vaping Use   Vaping status: Not on file  Substance and Sexual Activity   Alcohol use: No   Drug use: No   Sexual activity: Not on file  Other Topics Concern   Not on file  Social History Narrative   Single   Social Drivers of Health   Financial Resource Strain: Low Risk  (02/21/2024)   Overall Financial Resource Strain (CARDIA)    Difficulty of Paying Living Expenses: Not hard at all  Food Insecurity: No Food Insecurity (02/21/2024)   Hunger Vital Sign    Worried About Running Out of Food in the  Last Year: Never true    Ran Out of Food in the Last Year: Never true  Transportation Needs: No Transportation Needs (02/21/2024)   PRAPARE - Administrator, Civil Service (Medical): No    Lack of Transportation (Non-Medical): No  Physical Activity: Sufficiently Active (02/21/2024)   Exercise Vital Sign    Days of Exercise per Week: 5 days    Minutes of Exercise per Session: 60 min  Stress: No Stress Concern Present (02/21/2024)   [employer] of Occupational Health - Occupational Stress Questionnaire    Feeling of Stress : Not at all  Social Connections: Moderately Integrated (02/21/2024)   Social Connection and Isolation Panel [NHANES]     Frequency of Communication with Friends and Family: More than three times a week    Frequency of Social Gatherings with Friends and Family: More than three times a week    Attends Religious Services: More than 4 times per year    Active Member of [employer] or Organizations: Yes    Attends Engineer, structural: More than 4 times per year    Marital Status: Never married    Tobacco Counseling Counseling given: No    Clinical Intake:  Pre-visit preparation completed: Yes  Pain : No/denies pain     BMI - recorded: 30.13 Nutritional Status: BMI > 30  Obese Nutritional Risks: None Diabetes: No  No results found for: "HGBA1C"   How often do you need to have someone help you when you read instructions, pamphlets, or other written materials from your doctor or pharmacy?: 1 - Never  Interpreter Needed?: No  Information entered by :: Kandy Orris, CMA   Activities of Daily Living     02/21/2024    1:21 PM  In your present state of health, do you have any difficulty performing the following activities:  Hearing? 0  Vision? 0  Difficulty concentrating or making decisions? 0  Walking or climbing stairs? 0  Dressing or bathing? 0  Doing errands, shopping? 0  Preparing Food and eating ? N  Using the Toilet? N  In the past six months, have you accidently leaked urine? N  Do you have problems with loss of bowel control? N  Managing your Medications? N  Managing your Finances? N  Housekeeping or managing your Housekeeping? N    Patient Care Team: Plotnikov, Oakley Bellman, MD as PCP - General (Internal Medicine) Plotnikov, Oakley Bellman, MD (Internal Medicine) [facility]  Indicate any recent Medical Services you may have received from other than [facility] providers in the past year (date may be approximate).     Assessment:   This is a routine wellness examination for [facility].  Hearing/Vision screen Hearing Screening - Comments:: Denies hearing difficulties   Vision Screening  - Comments:: Wears rx glasses - up to date with routine eye exams with [facility]   Goals Addressed               This Visit's Progress     Patient Stated (pt-stated)        Patient stated he wants to be healthy       Depression Screen     02/21/2024    1:30 PM 05/25/2023   10:49 AM 04/15/2022    9:10 AM 05/15/2020    1:38 PM 07/11/2017   10:45 AM  PHQ 2/9 Scores  PHQ - 2 Score 0 0 0 0 0  PHQ- 9 Score 0        Fall Risk  02/21/2024    1:31 PM 05/25/2023   10:49 AM 04/15/2022    9:10 AM  Fall Risk   Falls in the past year? 0 0 0  Number falls in past yr: 0 0 0  Injury with Fall? 0 0 0  Risk for fall due to : No Fall Risks No Fall Risks No Fall Risks  Follow up Falls prevention discussed;Falls evaluation completed Falls evaluation completed Falls evaluation completed    MEDICARE RISK AT HOME:  Medicare Risk at Home Any stairs in or around the home?: No If so, are there any without handrails?: No Home free of loose throw rugs in walkways, pet beds, electrical cords, etc?: Yes Adequate lighting in your home to reduce risk of falls?: Yes Life alert?: No Use of a cane, walker or w/c?: No Grab bars in the bathroom?: Yes Shower chair or bench in shower?: Yes Elevated toilet seat or a handicapped toilet?: Yes  TIMED UP AND GO:  Was the test performed?  No  Cognitive Function: 6CIT completed        02/21/2024    1:35 PM  6CIT Screen  What Year? 0 points  What month? 0 points  What time? 0 points  Count back from 20 0 points  Months in reverse 0 points  Repeat phrase 0 points  Total Score 0 points    Immunizations Immunization History  Administered Date(s) Administered   Influenza,inj,Quad PF,6+ Mos 07/11/2017   Tdap 11/29/2017    Screening Tests Health Maintenance  Topic Date Due   HIV Screening  Never done   Hepatitis C Screening  Never done   COVID-19 Vaccine (1 - 2024-25 season) Never done   INFLUENZA VACCINE  06/01/2024   Medicare Annual  Wellness (AWV)  02/20/2025   DTaP/Tdap/Td (2 - Td or Tdap) 11/30/2027   HPV VACCINES  Aged Out   Meningococcal B Vaccine  Aged Out    Health Maintenance  Health Maintenance Due  Topic Date Due   HIV Screening  Never done   Hepatitis C Screening  Never done   COVID-19 Vaccine (1 - 2024-25 season) Never done   Health Maintenance Items Addressed: 02/21/2024   Additional Screening:  Vision Screening: Recommended annual ophthalmology exams for early detection of glaucoma and other disorders of the eye.  Dental Screening: Recommended annual dental exams for proper oral hygiene  Community Resource Referral / Chronic Care Management: CRR required this visit?  No   CCM required this visit?  No     Plan:     I have personally reviewed and noted the following in the patient's chart:   Medical and social history Use of alcohol, tobacco or illicit drugs  Current medications and supplements including opioid prescriptions. Patient is not currently taking opioid prescriptions. Functional ability and status Nutritional status Physical activity Advanced directives List of other physicians Hospitalizations, surgeries, and ER visits in previous 12 months Vitals Screenings to include cognitive, depression, and falls Referrals and appointments  In addition, I have reviewed and discussed with patient certain preventive protocols, quality metrics, and best practice recommendations. A written personalized care plan for preventive services as well as general preventive health recommendations were provided to patient.     Patria Bookbinder, CMA   02/21/2024   After Visit Summary: (In Person-Printed) AVS printed and given to the patient  Notes: Nothing significant to report at this time.  Medical screening examination/treatment/procedure(s) were performed by non-physician practitioner and as supervising physician I was immediately available for  consultation/collaboration.  I agree with  above. Adelaide Holy, MD

## 2024-02-22 LAB — HEPATITIS C ANTIBODY: Hepatitis C Ab: NONREACTIVE

## 2024-03-14 ENCOUNTER — Ambulatory Visit: Admitting: Internal Medicine

## 2024-03-16 ENCOUNTER — Ambulatory Visit (INDEPENDENT_AMBULATORY_CARE_PROVIDER_SITE_OTHER): Admitting: Internal Medicine

## 2024-03-16 ENCOUNTER — Encounter: Payer: Self-pay | Admitting: Internal Medicine

## 2024-03-16 ENCOUNTER — Ambulatory Visit: Payer: Self-pay

## 2024-03-16 ENCOUNTER — Telehealth: Payer: Self-pay

## 2024-03-16 VITALS — BP 116/84 | HR 100 | Temp 98.0°F | Ht 70.0 in | Wt 212.0 lb

## 2024-03-16 DIAGNOSIS — R55 Syncope and collapse: Secondary | ICD-10-CM | POA: Diagnosis not present

## 2024-03-16 DIAGNOSIS — N183 Chronic kidney disease, stage 3 unspecified: Secondary | ICD-10-CM | POA: Insufficient documentation

## 2024-03-16 DIAGNOSIS — N182 Chronic kidney disease, stage 2 (mild): Secondary | ICD-10-CM | POA: Insufficient documentation

## 2024-03-16 NOTE — Telephone Encounter (Signed)
 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 She was wanting to know if there was anything to have caused this, or some general advise on what plans will be going forward? Informed her I would call back after hearing from Dr.Plotnikov.

## 2024-03-16 NOTE — Assessment & Plan Note (Addendum)
 Continue to hydrate well No relapse

## 2024-03-16 NOTE — Assessment & Plan Note (Addendum)
 CRI st 2-3 of ?etiology Renal US  was ordered [facility] well Nephrology ref Monitor labs

## 2024-03-19 ENCOUNTER — Other Ambulatory Visit (INDEPENDENT_AMBULATORY_CARE_PROVIDER_SITE_OTHER)

## 2024-03-19 ENCOUNTER — Ambulatory Visit
Admission: RE | Admit: 2024-03-19 | Discharge: 2024-03-19 | Disposition: A | Discharge status: Home / Self Care | Source: Ambulatory Visit | Attending: Internal Medicine | Admitting: Internal Medicine

## 2024-03-19 DIAGNOSIS — N182 Chronic kidney disease, stage 2 (mild): Secondary | ICD-10-CM

## 2024-03-19 LAB — COMPREHENSIVE METABOLIC PANEL WITH GFR
ALT: 18 U/L (ref 0–53)
AST: 20 U/L (ref 0–37)
Albumin: 4.2 g/dL (ref 3.5–5.2)
Alkaline Phosphatase: 50 U/L (ref 39–117)
BUN: 23 mg/dL (ref 6–23)
CO2: 25 meq/L (ref 19–32)
Calcium: 9.5 mg/dL (ref 8.4–10.5)
Chloride: 102 meq/L (ref 96–112)
Creatinine, Ser: 1.72 mg/dL — ABNORMAL HIGH (ref 0.40–1.50)
GFR: 53.89 mL/min — ABNORMAL LOW (ref >60.00)
Glucose, Bld: 85 mg/dL (ref 70–99)
Potassium: 4.2 meq/L (ref 3.5–5.1)
Sodium: 135 meq/L (ref 135–145)
Total Bilirubin: 0.6 mg/dL (ref 0.2–1.2)
Total Protein: 7.1 g/dL (ref 6.0–8.3)

## 2024-03-19 NOTE — Telephone Encounter (Signed)
 Copied from CRM [id number]. Topic: Referral - Question >> Mar 16, 2024  2:28 PM Aisha D wrote: Reason for CRM: Pt's mother is requesting to speak to Dr.Plotnikov or his nurse in regards to the referral for US  renal. Pt stated that she is not getting any information about the referral and would like for someone to give her a callback today if possible.

## 2024-03-19 NOTE — Progress Notes (Signed)
 Subjective:  Patient ID: David Cook, male    DOB: Jun 05, 1996  Age: 28 y.o. MRN: [id number]  CC: Medical Management of Chronic Issues (6 Month follow up. Notes not having anymore syncopal episodes since last visit)   HPI David Cook presents for CRI, syncope - no relapse He is here w/his aunt  Outpatient Medications Prior to Visit  Medication Sig Dispense Refill   b complex vitamins tablet Take 1 tablet by mouth daily. 100 tablet 3   Cholecalciferol (VITAMIN D3) 2000 units capsule Take 1 capsule (2,000 Units total) by mouth daily. 100 capsule 3   clobetasol  (TEMOVATE ) 0.05 % external solution Apply 1 Application topically 2 (two) times daily. 50 mL 3   clobetasol  cream (TEMOVATE ) 0.05 % Apply 1 Application topically 2 (two) times daily. 120 g 2   diphenhydrAMINE  (BENADRYL ) 25 MG tablet Take 2 tablets (50 mg total) by mouth every 6 (six) hours as needed for itching or allergies. 60 tablet 2   lactase (LACTAID) 3000 units tablet Take 2 tablets (6,000 Units total) by mouth 3 (three) times daily with meals as needed (when eating cheese, pizza). 120 tablet 3   loratadine -pseudoephedrine  (CLARITIN -D 24 HOUR) 10-240 MG 24 hr tablet Take 1 tablet by mouth daily. 30 tablet 5   Vitamin D , Ergocalciferol , (DRISDOL ) 1.25 MG (50000 UNIT) CAPS capsule Take 1 capsule (50,000 Units total) by mouth every 7 (seven) days. 8 capsule 0   EPINEPHrine  0.3 mg/0.3 mL IJ SOAJ injection Inject 0.3 mg into the muscle as needed for anaphylaxis. (Patient not taking: Reported on 03/16/2024) 1 each 0   No facility-administered medications prior to visit.    ROS: Review of Systems  Constitutional:  Negative for appetite change, fatigue and unexpected weight change.  HENT:  Negative for congestion, nosebleeds, sneezing, sore throat and trouble swallowing.   Eyes:  Negative for itching and visual disturbance.  Respiratory:  Negative for cough.   Cardiovascular:  Negative for chest pain, palpitations and leg  swelling.  Gastrointestinal:  Negative for abdominal distention, blood in stool, diarrhea and nausea.  Genitourinary:  Negative for frequency and hematuria.  Musculoskeletal:  Negative for back pain, gait problem, joint swelling and neck pain.  Skin:  Negative for rash.  Neurological:  Negative for dizziness, tremors, syncope, speech difficulty and weakness.  Psychiatric/Behavioral:  Positive for decreased concentration. Negative for agitation, dysphoric mood, sleep disturbance and suicidal ideas. The patient is not nervous/anxious.     Objective:  BP 116/84   Pulse 100   Temp 98 F (36.7 C)   Ht 5\' 10"  (1.778 m)   Wt 212 lb (96.2 kg)   SpO2 96%   BMI 30.42 kg/m   BP Readings from Last 3 Encounters:  03/16/24 116/84  02/21/24 120/60  02/01/24 (!) 142/86    Wt Readings from Last 3 Encounters:  03/16/24 212 lb (96.2 kg)  02/21/24 210 lb (95.3 kg)  02/01/24 214 lb 12.8 oz (97.4 kg)    Physical Exam Constitutional:      General: He is not in acute distress.    Appearance: Normal appearance. He is well-developed.     Comments: NAD  Eyes:     Conjunctiva/sclera: Conjunctivae normal.     Pupils: Pupils are equal, round, and reactive to light.  Neck:     Thyroid : No thyromegaly.     Vascular: No JVD.  Cardiovascular:     Rate and Rhythm: Normal rate and regular rhythm.     Heart sounds: Normal heart  sounds. No murmur heard.    No friction rub. No gallop.  Pulmonary:     Effort: Pulmonary effort is normal. No respiratory distress.     Breath sounds: Normal breath sounds. No wheezing or rales.  Chest:     Chest wall: No tenderness.  Abdominal:     General: Bowel sounds are normal. There is no distension.     Palpations: Abdomen is soft. There is no mass.     Tenderness: There is no abdominal tenderness. There is no guarding or rebound.  Musculoskeletal:        General: No tenderness. Normal range of motion.     Cervical back: Normal range of motion.     Right lower  leg: No edema.     Left lower leg: No edema.  Lymphadenopathy:     Cervical: No cervical adenopathy.  Skin:    General: Skin is warm and dry.     Findings: No rash.  Neurological:     Mental Status: He is alert and oriented to person, place, and time. Mental status is at baseline.     Cranial Nerves: No cranial nerve deficit.     Motor: No abnormal muscle tone.     Coordination: Coordination normal.     Gait: Gait normal.     Deep Tendon Reflexes: Reflexes are normal and symmetric.  Psychiatric:        Behavior: Behavior normal.        Thought Content: Thought content normal.     Lab Results  Component Value Date   WBC 5.1 02/01/2024   HGB 14.4 02/01/2024   HCT 43.7 02/01/2024   PLT 268.0 02/01/2024   GLUCOSE 92 02/21/2024   CHOL 117 07/11/2017   TRIG 79.0 07/11/2017   HDL 42.60 07/11/2017   LDLCALC 58 07/11/2017   ALT 17 02/21/2024   AST 19 02/21/2024   NA 139 02/21/2024   K 4.0 02/21/2024   CL 104 02/21/2024   CREATININE 1.64 (H) 02/21/2024   BUN 17 02/21/2024   CO2 30 02/21/2024   TSH 1.80 02/01/2024   MICROALBUR 186.7 (H) 02/21/2024    No results found.  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     Follow-up: Return in about 4 months (around 07/17/2024) for a follow-up visit.  Anitra Barn, MD

## 2024-03-19 NOTE — Telephone Encounter (Signed)
 I am not sure why yet.  We're in the process of getting more information.  Jahid needs to continue to hydrate himself well.  Thank you

## 2024-03-19 NOTE — Telephone Encounter (Signed)
 Spoke with pts mom and was able to inform her of the reasoning for the US  and blood work needed. She has stated understanding.

## 2024-03-20 NOTE — Telephone Encounter (Signed)
 Thank you :)

## 2024-03-20 NOTE — Telephone Encounter (Signed)
 Copied from CRM [id number]. Topic: Clinical - Lab/Test Results >> Mar 20, 2024  9:12 AM Opal Bill wrote: Reason for CRM: Pt's mom Ray called to get results from yesterday's testing. Asking if Dr. Georgia Kipper or his nurse can call her directly. [phone]

## 2024-03-21 ENCOUNTER — Ambulatory Visit: Payer: Self-pay | Admitting: Internal Medicine

## 2024-03-22 ENCOUNTER — Ambulatory Visit: Payer: Self-pay

## 2024-03-22 NOTE — Telephone Encounter (Signed)
 Chief Complaint: lab results Symptoms: NA Frequency: NA Pertinent Negatives: Patient denies NA Disposition: [] ED /[] Urgent Care (no appt availability in office) / [] Appointment(In office/virtual)/ []  [place] [facility]/ [] Home Care/ [] Refused Recommended Disposition /[] [place] [facility]/ [x]  Follow-up with PCP Additional Notes:  Heather Streeper (Mom) calling [phone]) to schedule to review for labs. No DPR on file, no PHI was shared by this Clinical research associate. He is autistic and she helps manage his care. She is upset to learn he has CKD stage II and nobody has talked with them about it. Offered next available visit with Dr. Georgia Kipper on 04/04/24, she declined this and would like sooner appointment, anxious re: ckd and would like education by pcp asap. Willing for phone conversation. Added to wait list. Please follow up with mom Ray [phone].    Copied from CRM [id number]. Topic: Clinical - Medication Question >> Mar 22, 2024 10:16 AM Chrystal Crape R wrote: Mom calling in very nervous about pt lab result. Need someone to explain it before she schedules an appointment for her son (the pt) Reason for Disposition  [1] Caller requests to speak ONLY to PCP AND [2] NON-URGENT question  Protocols used: PCP Call - No Triage-A-AH

## 2024-03-23 NOTE — Telephone Encounter (Signed)
 This is a new issue.  We are in the process of figuring it out: we need more time.  David Cook was coming in with his aunt ([name]'s sister David Cook) -she is aware of the problem.  [name] needs to continue to hydrate himself well. Thank you

## 2024-03-27 NOTE — Telephone Encounter (Signed)
 Discussed with patient via MyChart. Have them scheduled for a virtual with Dr.Plotnikov for this Thursday

## 2024-03-29 ENCOUNTER — Telehealth (INDEPENDENT_AMBULATORY_CARE_PROVIDER_SITE_OTHER): Admitting: Internal Medicine

## 2024-03-29 ENCOUNTER — Encounter: Payer: Self-pay | Admitting: Internal Medicine

## 2024-03-29 DIAGNOSIS — Z91013 Allergy to seafood: Secondary | ICD-10-CM

## 2024-03-29 DIAGNOSIS — R030 Elevated blood-pressure reading, without diagnosis of hypertension: Secondary | ICD-10-CM

## 2024-03-29 DIAGNOSIS — Z91010 Allergy to peanuts: Secondary | ICD-10-CM | POA: Diagnosis not present

## 2024-03-29 DIAGNOSIS — L309 Dermatitis, unspecified: Secondary | ICD-10-CM

## 2024-03-29 DIAGNOSIS — R55 Syncope and collapse: Secondary | ICD-10-CM

## 2024-03-29 DIAGNOSIS — N182 Chronic kidney disease, stage 2 (mild): Secondary | ICD-10-CM

## 2024-03-29 MED ORDER — EPINEPHRINE 0.3 MG/0.3ML IJ SOAJ: 0.3000 mg | INTRAMUSCULAR | 3 refills | Status: AC | PRN

## 2024-03-29 MED ORDER — CLOBETASOL PROPIONATE 0.05 % EX SOLN: 1.0000 | Freq: Two times a day (BID) | CUTANEOUS | 3 refills | Status: DC

## 2024-03-29 MED ORDER — CLOBETASOL PROPIONATE 0.05 % EX CREA: 1.0000 | TOPICAL_CREAM | Freq: Two times a day (BID) | CUTANEOUS | 2 refills | Status: DC

## 2024-03-29 NOTE — Assessment & Plan Note (Signed)
No relapse 

## 2024-03-29 NOTE — Assessment & Plan Note (Signed)
Clobetasol sol Clobetasol cream

## 2024-03-29 NOTE — Assessment & Plan Note (Signed)
 For an allergic reaction use Epipen , Benadryl , Prednisone  40 mg prn  EpiPen  prescription was renewed

## 2024-03-29 NOTE — Assessment & Plan Note (Signed)
 CRI st 2-3 of ?etiology Renal US  03/2024: Mild increased echogenicity of the renal cortices without significant thinning, consistent with chronic kidney disease.David Cook  No obstruction Discussed with Hydrate well.  Avoid NSAIDs Nephrology ref Dr Lydia Sams -appointment is pending Monitor labs

## 2024-03-29 NOTE — Assessment & Plan Note (Signed)
 No relapse.  Continue to monitor BP at home.

## 2024-03-29 NOTE — Progress Notes (Signed)
 Virtual Visit via Video Note  I connected with David Cook on 03/29/24 at  4:00 PM EDT by a video enabled telemedicine application and verified that I am speaking with the correct person using two identifiers.   I discussed the limitations of evaluation and management by telemedicine and the availability of in person appointments. The patient expressed understanding and agreed to proceed.  I was located at our [facility] office. The patient was at home. His mom was present in the visit.  Chief Complaint  Patient presents with   Labs Only    Discuss recent labs and abnormal fidnings     History of Present Illness:  F/u on CRI, allergies, eczema BP is ok at home  Review of Systems  Constitutional:  Negative for diaphoresis and weight loss.  Genitourinary:  Negative for dysuria, frequency and hematuria.  Neurological:  Negative for dizziness, loss of consciousness and weakness.     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  Continue to monitor BP at home.       Near syncope   No relapse      Relevant Medications   EPINEPHrine  0.3 mg/0.3 mL IJ SOAJ injection   CRI (chronic renal insufficiency), stage 2 (mild)   CRI st 2-3 of ?etiology Renal US  03/2024: Mild increased echogenicity of the renal cortices without significant thinning, consistent with chronic kidney disease.Aaron Aas  No obstruction Discussed with Hydrate well.  Avoid NSAIDs Nephrology ref Dr Lydia Sams -appointment is pending Monitor  labs        Meds ordered this encounter  Medications   clobetasol  (TEMOVATE ) 0.05 % external solution    Sig: Apply 1 Application topically 2 (two) times daily.    Dispense:  50 mL    Refill:  3   clobetasol  cream (TEMOVATE ) 0.05 %    Sig: Apply 1 Application topically 2 (two) times daily.    Dispense:  120 g    Refill:  2   EPINEPHrine  0.3 mg/0.3 mL IJ SOAJ injection    Sig: Inject 0.3 mg into the muscle as needed for anaphylaxis.    Dispense:  1 each    Refill:  3     Follow Up Instructions:    I discussed the assessment and treatment plan with the patient. The patient was provided an opportunity to ask questions and all were answered. The patient agreed with the plan and demonstrated an understanding of the instructions.   The patient was advised to call back or seek an in-person evaluation if the symptoms worsen or if the condition fails to improve as anticipated.  I provided face-to-face time during this encounter. We were at different locations.   Anitra Barn, MD

## 2024-03-29 NOTE — Progress Notes (Deleted)
 Subjective:  Patient ID: David Cook, male    DOB: 04/30/1996  Age: 28 y.o. MRN: [id number]  CC: Labs Only (Discuss recent labs and abnormal fidnings)   HPI [name] presents for ***  Outpatient Medications Prior to Visit  Medication Sig Dispense Refill   b complex vitamins tablet Take 1 tablet by mouth daily. 100 tablet 3   Cholecalciferol (VITAMIN D3) 2000 units capsule Take 1 capsule (2,000 Units total) by mouth daily. 100 capsule 3   clobetasol  (TEMOVATE ) 0.05 % external solution Apply 1 Application topically 2 (two) times daily. 50 mL 3   clobetasol  cream (TEMOVATE ) 0.05 % Apply 1 Application topically 2 (two) times daily. 120 g 2   diphenhydrAMINE  (BENADRYL ) 25 MG tablet Take 2 tablets (50 mg total) by mouth every 6 (six) hours as needed for itching or allergies. 60 tablet 2   EPINEPHrine  0.3 mg/0.3 mL IJ SOAJ injection Inject 0.3 mg into the muscle as needed for anaphylaxis. 1 each 0   lactase (LACTAID) 3000 units tablet Take 2 tablets (6,000 Units total) by mouth 3 (three) times daily with meals as needed (when eating cheese, pizza). 120 tablet 3   loratadine -pseudoephedrine  (CLARITIN -D 24 HOUR) 10-240 MG 24 hr tablet Take 1 tablet by mouth daily. 30 tablet 5   Vitamin D , Ergocalciferol , (DRISDOL ) 1.25 MG (50000 UNIT) CAPS capsule Take 1 capsule (50,000 Units total) by mouth every 7 (seven) days. 8 capsule 0   No facility-administered medications prior to visit.    ROS: Review of Systems  Objective:  There were no vitals taken for this visit.  BP Readings from Last 3 Encounters:  03/16/24 116/84  02/21/24 120/60  02/01/24 (!) 142/86    Wt Readings from Last 3 Encounters:  03/16/24 212 lb (96.2 kg)  02/21/24 210 lb (95.3 kg)  02/01/24 214 lb 12.8 oz (97.4 kg)    Physical Exam  Lab Results  Component Value Date   WBC 5.1 02/01/2024   HGB 14.4 02/01/2024   HCT 43.7 02/01/2024   PLT 268.0 02/01/2024   GLUCOSE 85 03/19/2024   CHOL 117 07/11/2017    TRIG 79.0 07/11/2017   HDL 42.60 07/11/2017   LDLCALC 58 07/11/2017   ALT 18 03/19/2024   AST 20 03/19/2024   NA 135 03/19/2024   K 4.2 03/19/2024   CL 102 03/19/2024   CREATININE 1.72 (H) 03/19/2024   BUN 23 03/19/2024   CO2 25 03/19/2024   TSH 1.80 02/01/2024   MICROALBUR 186.7 (H) 02/21/2024    US  RENAL Result Date: 03/22/2024 CLINICAL DATA:  CRI stage 2-3 EXAM: RENAL / URINARY TRACT ULTRASOUND COMPLETE COMPARISON:  CT abdomen pelvis 03/25/2021 FINDINGS: Right Kidney: Renal measurements: 8.1 x 3.8 x 3.9 cm = volume: 62 mL. Mild increased echogenicity the renal cortex without significant thinning. No mass or hydronephrosis visualized. Left Kidney: Renal measurements: 9.6 x 4.3 x 4.8 cm = volume: 104 mL. Mild increased echogenicity the renal cortex without significant thinning. No mass or hydronephrosis visualized. Bladder: Appears normal for degree of bladder distention. Other: None. IMPRESSION: Mild increased echogenicity of the renal cortices without significant thinning, consistent with chronic kidney disease. Electronically Signed   By: Elester Grim M.D.   On: 03/22/2024 08:46    Assessment & Plan:   Problem List Items Addressed This Visit   None     No orders of the defined types were placed in this encounter.     Follow-up: No follow-ups on file.  Anitra Barn, MD

## 2024-04-16 ENCOUNTER — Other Ambulatory Visit: Payer: Self-pay | Admitting: Internal Medicine

## 2024-05-02 DIAGNOSIS — R809 Proteinuria, unspecified: Secondary | ICD-10-CM | POA: Diagnosis not present

## 2024-05-02 DIAGNOSIS — I129 Hypertensive chronic kidney disease with stage 1 through stage 4 chronic kidney disease, or unspecified chronic kidney disease: Secondary | ICD-10-CM | POA: Diagnosis not present

## 2024-05-02 DIAGNOSIS — N1831 Chronic kidney disease, stage 3a: Secondary | ICD-10-CM | POA: Diagnosis not present

## 2024-05-07 ENCOUNTER — Telehealth: Payer: Self-pay

## 2024-05-07 NOTE — Telephone Encounter (Signed)
 Copied from CRM [id number]. Topic: Clinical - Medical Advice >> May 07, 2024  2:20 PM Viola F wrote: Reason for CRM: Patient mother called, scheduled appointment for next available 05/15/24 - she would like to discuss results from [facility] but I do not see results in MyChart - please advise

## 2024-05-08 DIAGNOSIS — I129 Hypertensive chronic kidney disease with stage 1 through stage 4 chronic kidney disease, or unspecified chronic kidney disease: Secondary | ICD-10-CM | POA: Diagnosis not present

## 2024-05-08 DIAGNOSIS — R809 Proteinuria, unspecified: Secondary | ICD-10-CM | POA: Diagnosis not present

## 2024-05-08 DIAGNOSIS — N1831 Chronic kidney disease, stage 3a: Secondary | ICD-10-CM | POA: Diagnosis not present

## 2024-05-08 NOTE — Telephone Encounter (Signed)
 [facility] have a different medical record system - I do not see/have the results.  I may get the results later when they are sent to me.  They need to call [facility] to discuss results. Thanks

## 2024-05-08 NOTE — Telephone Encounter (Signed)
 Copied from CRM [id number]. Topic: General - Other >> May 08, 2024 10:40 AM Burnard DEL wrote: Reason for CRM: Patients  om would like to know if Dr Garald is going to need any blood work or a urine sample before he sees patient on July 15th? She said that if so she would like for him to have it before his appointment? She also would like to know if Dr Garald could coordinate things with doctor Dennise at [facility] to get updated test results from them.

## 2024-05-09 NOTE — Telephone Encounter (Signed)
 [facility] should send me a fax with David Cook's results/office consultation note. We do not need any lab work ahead of time, especially if he had it done recently with Dr. Dennise. Thank you

## 2024-05-11 ENCOUNTER — Other Ambulatory Visit (HOSPITAL_COMMUNITY): Payer: Self-pay | Admitting: Nephrology

## 2024-05-11 DIAGNOSIS — N1831 Chronic kidney disease, stage 3a: Secondary | ICD-10-CM

## 2024-05-11 NOTE — Telephone Encounter (Signed)
 LDVM for pts mom of providers instructions as follows [facility] should send me a fax with Wael's results/office consultation note. We do not need any lab work ahead of time, especially if he had it done recently with Dr. Dennise. Thank you

## 2024-05-15 ENCOUNTER — Ambulatory Visit (INDEPENDENT_AMBULATORY_CARE_PROVIDER_SITE_OTHER): Admitting: Internal Medicine

## 2024-05-15 ENCOUNTER — Encounter: Payer: Self-pay | Admitting: Internal Medicine

## 2024-05-15 VITALS — BP 130/88 | HR 100 | Temp 98.2°F | Ht 70.0 in

## 2024-05-15 DIAGNOSIS — N182 Chronic kidney disease, stage 2 (mild): Secondary | ICD-10-CM

## 2024-05-15 DIAGNOSIS — N183 Chronic kidney disease, stage 3 unspecified: Secondary | ICD-10-CM | POA: Diagnosis not present

## 2024-05-15 DIAGNOSIS — R55 Syncope and collapse: Secondary | ICD-10-CM | POA: Diagnosis not present

## 2024-05-15 DIAGNOSIS — L309 Dermatitis, unspecified: Secondary | ICD-10-CM | POA: Diagnosis not present

## 2024-05-15 DIAGNOSIS — R030 Elevated blood-pressure reading, without diagnosis of hypertension: Secondary | ICD-10-CM

## 2024-05-15 MED ORDER — CLOBETASOL PROPIONATE 0.05 % EX CREA: 1.0000 | TOPICAL_CREAM | Freq: Two times a day (BID) | CUTANEOUS | 5 refills | Status: AC

## 2024-05-15 MED ORDER — CLOBETASOL PROPIONATE 0.05 % EX SOLN: 1.0000 | Freq: Two times a day (BID) | CUTANEOUS | 5 refills | Status: AC

## 2024-05-15 NOTE — Progress Notes (Unsigned)
 Subjective:  Patient ID: David Cook, male    DOB: [birth year]  Age: 28 y.o. MRN: [id number]  CC: Medical Management of Chronic Issues (Pt is here to discuss labs that were done at [facility])   HPI David Cook presents for HTN. BP 140/100 at home On Losartan 12.5 mg every day BP 130/90 rechecked David Cook is here with his mother to follow-up on his renal insufficiency, elevated blood pressure, rashes.  No near-syncope or syncope David Cook saw Dr. Dennise  Mild stopped exercising and gained weight.  Okay to restart exercising 45 minutes a day with good hydration.  Outpatient Medications Prior to Visit  Medication Sig Dispense Refill   b complex vitamins tablet Take 1 tablet by mouth daily. 100 tablet 3   Cholecalciferol (VITAMIN D3) 2000 units capsule Take 1 capsule (2,000 Units total) by mouth daily. 100 capsule 3   diphenhydrAMINE  (BENADRYL ) 25 MG tablet Take 2 tablets (50 mg total) by mouth every 6 (six) hours as needed for itching or allergies. 60 tablet 2   EPINEPHrine  0.3 mg/0.3 mL IJ SOAJ injection Inject 0.3 mg into the muscle as needed for anaphylaxis. 1 each 3   lactase (LACTAID) 3000 units tablet Take 2 tablets (6,000 Units total) by mouth 3 (three) times daily with meals as needed (when eating cheese, pizza). 120 tablet 3   loratadine -pseudoephedrine  (CLARITIN -D 24 HOUR) 10-240 MG 24 hr tablet Take 1 tablet by mouth daily. 30 tablet 5   losartan (COZAAR) 25 MG tablet Take 12.5 mg by mouth at bedtime.     Vitamin D , Ergocalciferol , (DRISDOL ) 1.25 MG (50000 UNIT) CAPS capsule Take 1 capsule (50,000 Units total) by mouth every 7 (seven) days. After you finish this prescription, switch to vitamin D  3 2000 units daily (over-the-counter) 6 capsule 0   clobetasol  (TEMOVATE ) 0.05 % external solution Apply 1 Application topically 2 (two) times daily. 50 mL 3   clobetasol  cream (TEMOVATE ) 0.05 % Apply 1 Application topically 2 (two) times daily. 120 g 2   No  facility-administered medications prior to visit.    ROS: Review of Systems  Constitutional:  Negative for appetite change, fatigue and unexpected weight change.  HENT:  Negative for congestion, nosebleeds, sneezing, sore throat and trouble swallowing.   Eyes:  Negative for itching and visual disturbance.  Respiratory:  Negative for cough.   Cardiovascular:  Negative for chest pain, palpitations and leg swelling.  Gastrointestinal:  Negative for abdominal distention, blood in stool, diarrhea and nausea.  Genitourinary:  Negative for frequency and hematuria.  Musculoskeletal:  Negative for back pain, gait problem, joint swelling and neck pain.  Skin:  Negative for rash.  Neurological:  Negative for dizziness, tremors, speech difficulty and weakness.  Psychiatric/Behavioral:  Negative for agitation, dysphoric mood and sleep disturbance. The patient is not nervous/anxious.     Objective:  BP 130/88   Pulse 100   Temp 98.2 F (36.8 C) (Oral)   Ht 5' 10 (1.778 m)   SpO2 94%   BMI 30.42 kg/m   BP Readings from Last 3 Encounters:  05/15/24 130/88  03/16/24 116/84  02/21/24 120/60    Wt Readings from Last 3 Encounters:  03/16/24 212 lb (96.2 kg)  02/21/24 210 lb (95.3 kg)  02/01/24 214 lb 12.8 oz (97.4 kg)    Physical Exam Constitutional:      General: He is not in acute distress.    Appearance: He is well-developed.     Comments: NAD  Eyes:  Conjunctiva/sclera: Conjunctivae normal.     Pupils: Pupils are equal, round, and reactive to light.  Neck:     Thyroid : No thyromegaly.     Vascular: No JVD.  Cardiovascular:     Rate and Rhythm: Normal rate and regular rhythm.     Heart sounds: Normal heart sounds. No murmur heard.    No friction rub. No gallop.  Pulmonary:     Effort: Pulmonary effort is normal. No respiratory distress.     Breath sounds: Normal breath sounds. No wheezing or rales.  Chest:     Chest wall: No tenderness.  Abdominal:     General: Bowel  sounds are normal. There is no distension.     Palpations: Abdomen is soft. There is no mass.     Tenderness: There is no abdominal tenderness. There is no guarding or rebound.  Musculoskeletal:        General: No tenderness. Normal range of motion.     Cervical back: Normal range of motion.  Lymphadenopathy:     Cervical: No cervical adenopathy.  Skin:    General: Skin is warm and dry.     Findings: No rash.  Neurological:     Mental Status: He is alert and oriented to person, place, and time.     Cranial Nerves: No cranial nerve deficit.     Motor: No abnormal muscle tone.     Coordination: Coordination normal.     Gait: Gait normal.     Deep Tendon Reflexes: Reflexes are normal and symmetric.  Psychiatric:        Behavior: Behavior normal.        Thought Content: Thought content normal.        Judgment: Judgment normal.   Our manual blood pressure monitor shows blood pressure around 130/90.  Automatic home blood pressure monitor shows blood pressure 140/100.   Lab Results  Component Value Date   WBC 5.1 02/01/2024   HGB 14.4 02/01/2024   HCT 43.7 02/01/2024   PLT 268.0 02/01/2024   GLUCOSE 85 03/19/2024   CHOL 117 07/11/2017   TRIG 79.0 07/11/2017   HDL 42.60 07/11/2017   LDLCALC 58 07/11/2017   ALT 18 03/19/2024   AST 20 03/19/2024   NA 135 03/19/2024   K 4.2 03/19/2024   CL 102 03/19/2024   CREATININE 1.72 (H) 03/19/2024   BUN 23 03/19/2024   CO2 25 03/19/2024   TSH 1.80 02/01/2024   MICROALBUR 186.7 (H) 02/21/2024    US  RENAL Result Date: 03/22/2024 CLINICAL DATA:  CRI stage 2-3 EXAM: RENAL / URINARY TRACT ULTRASOUND COMPLETE COMPARISON:  CT abdomen pelvis 03/25/2021 FINDINGS: Right Kidney: Renal measurements: 8.1 x 3.8 x 3.9 cm = volume: 62 mL. Mild increased echogenicity the renal cortex without significant thinning. No mass or hydronephrosis visualized. Left Kidney: Renal measurements: 9.6 x 4.3 x 4.8 cm = volume: 104 mL. Mild increased echogenicity the renal  cortex without significant thinning. No mass or hydronephrosis visualized. Bladder: Appears normal for degree of bladder distention. Other: None. IMPRESSION: Mild increased echogenicity of the renal cortices without significant thinning, consistent with chronic kidney disease. Electronically Signed   By: Aliene Lloyd M.D.   On: 03/22/2024 08:46    Assessment & Plan:   Problem List Items Addressed This Visit     Eczema   Seborrheic dermatitis and eczema.  Clobetasol  sol for scalp Clobetasol  cream      Elevated BP without diagnosis of hypertension - Primary   Our manual blood  pressure monitor shows blood pressure around 130/90.  Automatic home blood pressure monitor shows blood pressure 140/100.  CRI st 3a with proteinuria.  Rule out FSGS (focal segmental glomerular sclerosis).  Kidney biopsy is pending. The patient is seeing Dr. Dennise Bx is pending On a low-dose losartan 12.5 mg daily.  May increase to twice a day if blood pressure remains elevated Mild stopped exercising and gained weight.  Okay to restart exercising 45 minutes a day with good hydration.      Near syncope   No relapse      Relevant Medications   losartan (COZAAR) 25 MG tablet   CRI (chronic renal insufficiency), stage 3 (moderate) (HCC)   CRI st 3a with proteinuria.  Rule out FSGS (focal segmental glomerular sclerosis).  Kidney biopsy is pending. The patient is seeing Dr. Dennise Bx is pending On a low-dose losartan 12.5 mg daily.  May increase to twice a day if blood pressure remains elevated         Meds ordered this encounter  Medications   clobetasol  (TEMOVATE ) 0.05 % external solution    Sig: Apply 1 Application topically 2 (two) times daily.    Dispense:  50 mL    Refill:  5   clobetasol  cream (TEMOVATE ) 0.05 %    Sig: Apply 1 Application topically 2 (two) times daily.    Dispense:  120 g    Refill:  5      Follow-up: Return in about 3 months (around 08/15/2024) for a follow-up visit.  Marolyn Noel, MD

## 2024-05-15 NOTE — Assessment & Plan Note (Signed)
 Our manual blood pressure monitor shows blood pressure around 130/90.  Automatic home blood pressure monitor shows blood pressure 140/100.  CRI st 3a with proteinuria.  Rule out FSGS (focal segmental glomerular sclerosis).  Kidney biopsy is pending. The patient is seeing Dr. Dennise Bx is pending On a low-dose losartan 12.5 mg daily.  May increase to twice a day if blood pressure remains elevated Mild stopped exercising and gained weight.  Okay to restart exercising 45 minutes a day with good hydration.

## 2024-05-15 NOTE — Assessment & Plan Note (Signed)
FSGS

## 2024-05-16 NOTE — Assessment & Plan Note (Signed)
No relapse 

## 2024-05-16 NOTE — Assessment & Plan Note (Signed)
 Seborrheic dermatitis and eczema.  Clobetasol  sol for scalp Clobetasol  cream

## 2024-05-21 ENCOUNTER — Encounter: Payer: Self-pay | Admitting: Internal Medicine

## 2024-05-24 ENCOUNTER — Telehealth: Payer: Self-pay | Admitting: Internal Medicine

## 2024-05-24 NOTE — Telephone Encounter (Unsigned)
 Copied from CRM [id number]. Topic: General - Other >> May 24, 2024 12:50 PM Rosina BIRCH wrote: Reason for CRM: patient mom called stating there has been a missed up in the patient medication and she would like to talk to the nurse regarding it. I called [facility] and they stated the nurse was not there today and everyone is at lunch right now. The patient mom stated the patient need a new script for his losartan because he has ran out due to the doctor changing how much he takes. The patient was taking one half a day now he is taking two halfs a day CB [phone]

## 2024-05-25 MED ORDER — LOSARTAN POTASSIUM 25 MG PO TABS: ORAL_TABLET | ORAL | 3 refills | Status: AC

## 2024-05-25 NOTE — Telephone Encounter (Signed)
 I was able to LDVM for the pts mom informing her I was able to get this medication to the pharmacy as requested.

## 2024-05-28 NOTE — Progress Notes (Signed)
 Chief Complaint: Chronic kidney disease stage IIIa -image guided percutaneous random kidney biopsy  Referring Provider(s): Dennise Hoes   Supervising Physician: Jennefer Rover  Patient Status: [facility] - Out-pt  History of Present Illness: David Cook is a 28 y.o. male with history of autism, seafood and peanut allergy, and chronic kidney disease stage IIIa.  Pt is followed by Dr. Dennise of nephrology for his chronic kidney disease stage IIIa.  Pt recently has been having elevated blood pressure readings at home and there is concern for possible renal insufficiency.  Pt was referred to interventional radiology for image guided percutaneous random kidney biopsy to aid in further diagnostic workup.    Patient is Full Code  Past Medical History:  Diagnosis Date   Allergy    seafood, peanuts   Autism     Past Surgical History:  Procedure Laterality Date   BACK SURGERY     ORCHIECTOMY Left    very remote    Allergies: Other  Medications: Prior to Admission medications   Medication Sig Start Date End Date Taking? Authorizing Provider  b complex vitamins tablet Take 1 tablet by mouth daily. 07/11/17   Plotnikov, Aleksei V, MD  Cholecalciferol (VITAMIN D3) 2000 units capsule Take 1 capsule (2,000 Units total) by mouth daily. 07/11/17   Plotnikov, Aleksei V, MD  clobetasol  (TEMOVATE ) 0.05 % external solution Apply 1 Application topically 2 (two) times daily. 05/15/24   Plotnikov, Aleksei V, MD  clobetasol  cream (TEMOVATE ) 0.05 % Apply 1 Application topically 2 (two) times daily. 05/15/24   Plotnikov, Aleksei V, MD  diphenhydrAMINE  (BENADRYL ) 25 MG tablet Take 2 tablets (50 mg total) by mouth every 6 (six) hours as needed for itching or allergies. 07/11/17   Plotnikov, Aleksei V, MD  EPINEPHrine  0.3 mg/0.3 mL IJ SOAJ injection Inject 0.3 mg into the muscle as needed for anaphylaxis. 03/29/24   Plotnikov, Aleksei V, MD  lactase (LACTAID) 3000 units tablet Take 2 tablets (6,000 Units  total) by mouth 3 (three) times daily with meals as needed (when eating cheese, pizza). 09/08/22   Plotnikov, Aleksei V, MD  loratadine -pseudoephedrine  (CLARITIN -D 24 HOUR) 10-240 MG 24 hr tablet Take 1 tablet by mouth daily. 04/15/22   Plotnikov, Aleksei V, MD  losartan  (COZAAR ) 25 MG tablet Take one whole tablet daily at bedtime. 05/25/24   Plotnikov, Aleksei V, MD  Vitamin D , Ergocalciferol , (DRISDOL ) 1.25 MG (50000 UNIT) CAPS capsule Take 1 capsule (50,000 Units total) by mouth every 7 (seven) days. After you finish this prescription, switch to vitamin D  3 2000 units daily (over-the-counter) 04/16/24   Plotnikov, Karlynn GAILS, MD     Family History  Problem Relation Age of Onset   Depression Mother    Heart disease Paternal Grandmother    Stroke Paternal Grandmother     Social History   Socioeconomic History   Marital status: Single    Spouse name: Not on file   Number of children: Not on file   Years of education: Not on file   Highest education level: Not on file  Occupational History   Not on file  Tobacco Use   Smoking status: Never    Passive exposure: Never   Smokeless tobacco: Never  Vaping Use   Vaping status: Not on file  Substance and Sexual Activity   Alcohol use: No   Drug use: No   Sexual activity: Not on file  Other Topics Concern   Not on file  Social History Narrative  Single   Social Drivers of Health   Financial Resource Strain: Low Risk  (02/21/2024)   Overall Financial Resource Strain (CARDIA)    Difficulty of Paying Living Expenses: Not hard at all  Food Insecurity: No Food Insecurity (02/21/2024)   Hunger Vital Sign    Worried About Running Out of Food in the Last Year: Never true    Ran Out of Food in the Last Year: Never true  Transportation Needs: No Transportation Needs (02/21/2024)   PRAPARE - Administrator, Civil Service (Medical): No    Lack of Transportation (Non-Medical): No  Physical Activity: Sufficiently Active (02/21/2024)    Exercise Vital Sign    Days of Exercise per Week: 5 days    Minutes of Exercise per Session: 60 min  Stress: No Stress Concern Present (02/21/2024)   [employer] of Occupational Health - Occupational Stress Questionnaire    Feeling of Stress : Not at all  Social Connections: Moderately Integrated (02/21/2024)   Social Connection and Isolation Panel    Frequency of Communication with Friends and Family: More than three times a week    Frequency of Social Gatherings with Friends and Family: More than three times a week    Attends Religious Services: More than 4 times per year    Active Member of [employer] or Organizations: Yes    Attends Engineer, structural: More than 4 times per year    Marital Status: Never married     Review of Systems: A 12 point ROS discussed and pertinent positives are indicated in the HPI above.  All other systems are negative.  Review of Systems  Constitutional:  Negative for fatigue and fever.  HENT:  Negative for congestion and dental problem.   Respiratory:  Negative for cough, chest tightness, shortness of breath and wheezing.   Cardiovascular:  Negative for chest pain.  Gastrointestinal:  Negative for diarrhea, nausea and vomiting.  Neurological:  Negative for light-headedness and headaches.  Psychiatric/Behavioral:  Negative for agitation, behavioral problems and confusion.     Vital Signs: BP (!) 140/105   Pulse 99   Temp 98.1 F (36.7 C) (Oral)   Resp 20   Ht 6' (1.829 m)   Wt 216 lb (98 kg)   SpO2 96%   BMI 29.29 kg/m   Advance Care Plan: The advanced care place/surrogate decision maker was discussed at the time of visit and the patient did not wish to discuss or was not able to name a surrogate decision maker or provide an advance care plan.  Physical Exam Constitutional:      Appearance: Normal appearance.  HENT:     Head: Normocephalic and atraumatic.     Mouth/Throat:     Mouth: Mucous membranes are moist.   Cardiovascular:     Rate and Rhythm: Normal rate and regular rhythm.  Pulmonary:     Effort: Pulmonary effort is normal.     Breath sounds: Normal breath sounds.  Abdominal:     General: Bowel sounds are normal.     Palpations: Abdomen is soft.  Musculoskeletal:        General: Normal range of motion.     Cervical back: Normal range of motion.  Skin:    General: Skin is warm and dry.  Neurological:     Mental Status: He is alert and oriented to person, place, and time.  Psychiatric:        Mood and Affect: Mood normal.  Behavior: Behavior normal.     Imaging: No results found.  Labs:  CBC: Recent Labs    02/01/24 1006 05/31/24 0645  WBC 5.1 6.4  HGB 14.4 14.6  HCT 43.7 45.3  PLT 268.0 300    COAGS: Recent Labs    05/31/24 0645  INR 1.0    BMP: Recent Labs    02/01/24 1006 02/21/24 1349 03/19/24 1436 05/31/24 0645  NA 139 139 135 138  K 3.8 4.0 4.2 3.9  CL 109 104 102 108  CO2 22 30 25  21*  GLUCOSE 92 92 85 95  BUN 24* 17 23 23*  CALCIUM 8.9 9.7 9.5 9.0  CREATININE 1.59* 1.64* 1.72* 1.59*  GFRNONAA  --   --   --  >60    LIVER FUNCTION TESTS: Recent Labs    02/01/24 1006 02/21/24 1349 03/19/24 1436  BILITOT 0.5 0.6 0.6  AST 20 19 20   ALT 18 17 18   ALKPHOS 48 54 50  PROT 6.6 7.5 7.1  ALBUMIN 3.9 4.4 4.2    TUMOR MARKERS: No results for input(s): AFPTM, CEA, CA199, CHROMGRNA in the last 8760 hours.  Assessment and Plan:  Patient is with Chronic kidney disease stage IIIa scheduled for image guided percutaneous random kidney biopsy 05/31/2024.  Risks and benefits of image guided random renal biopsy was discussed with the patient and/or patient's family including, but not limited to bleeding, infection, damage to adjacent structures or low yield requiring additional tests.  All of the questions were answered and there is agreement to proceed.  Consent signed and in chart.   Thank you for allowing our service to  participate in USIEL ASTARITA 's care.  Electronically Signed: Lavanda JAYSON Jurist, PA-C   05/31/2024, 8:08 AM    I spent a total of  30 Minutes   in face to face in clinical consultation, greater than 50% of which was counseling/coordinating care for image guided random renal biopsy.

## 2024-05-29 ENCOUNTER — Other Ambulatory Visit: Payer: Self-pay | Admitting: Radiology

## 2024-05-29 DIAGNOSIS — N183 Chronic kidney disease, stage 3 unspecified: Secondary | ICD-10-CM

## 2024-05-30 ENCOUNTER — Other Ambulatory Visit: Payer: Self-pay | Admitting: Student

## 2024-05-30 ENCOUNTER — Other Ambulatory Visit (HOSPITAL_COMMUNITY): Payer: Self-pay | Admitting: Student

## 2024-05-30 ENCOUNTER — Other Ambulatory Visit (HOSPITAL_COMMUNITY): Payer: Self-pay | Admitting: Radiology

## 2024-05-31 ENCOUNTER — Ambulatory Visit (HOSPITAL_COMMUNITY)
Admission: RE | Admit: 2024-05-31 | Discharge: 2024-05-31 | Disposition: A | Discharge status: Home / Self Care | Source: Ambulatory Visit | Attending: Nephrology | Admitting: Nephrology

## 2024-05-31 ENCOUNTER — Encounter (HOSPITAL_COMMUNITY): Payer: Self-pay

## 2024-05-31 ENCOUNTER — Other Ambulatory Visit: Payer: Self-pay

## 2024-05-31 DIAGNOSIS — Z9889 Other specified postprocedural states: Secondary | ICD-10-CM | POA: Insufficient documentation

## 2024-05-31 DIAGNOSIS — N183 Chronic kidney disease, stage 3 unspecified: Secondary | ICD-10-CM | POA: Diagnosis present

## 2024-05-31 DIAGNOSIS — Z91128 Patient's intentional underdosing of medication regimen for other reason: Secondary | ICD-10-CM | POA: Diagnosis not present

## 2024-05-31 DIAGNOSIS — I129 Hypertensive chronic kidney disease with stage 1 through stage 4 chronic kidney disease, or unspecified chronic kidney disease: Secondary | ICD-10-CM | POA: Diagnosis not present

## 2024-05-31 DIAGNOSIS — R809 Proteinuria, unspecified: Secondary | ICD-10-CM | POA: Diagnosis present

## 2024-05-31 DIAGNOSIS — N1831 Chronic kidney disease, stage 3a: Secondary | ICD-10-CM | POA: Insufficient documentation

## 2024-05-31 DIAGNOSIS — Z5309 Procedure and treatment not carried out because of other contraindication: Secondary | ICD-10-CM | POA: Diagnosis not present

## 2024-05-31 DIAGNOSIS — T465X6A Underdosing of other antihypertensive drugs, initial encounter: Secondary | ICD-10-CM | POA: Diagnosis not present

## 2024-05-31 LAB — BASIC METABOLIC PANEL WITH GFR
Anion gap: 9 (ref 5–15)
BUN: 23 mg/dL — ABNORMAL HIGH (ref 6–20)
CO2: 21 mmol/L — ABNORMAL LOW (ref 22–32)
Calcium: 9 mg/dL (ref 8.9–10.3)
Chloride: 108 mmol/L (ref 98–111)
Creatinine, Ser: 1.59 mg/dL — ABNORMAL HIGH (ref 0.61–1.24)
GFR, Estimated: >60 mL/min (ref >60)
Glucose, Bld: 95 mg/dL (ref 70–99)
Potassium: 3.9 mmol/L (ref 3.5–5.1)
Sodium: 138 mmol/L (ref 135–145)

## 2024-05-31 LAB — CBC
HCT: 45.3 % (ref 39.0–52.0)
Hemoglobin: 14.6 g/dL (ref 13.0–17.0)
MCH: 25.5 pg — ABNORMAL LOW (ref 26.0–34.0)
MCHC: 32.2 g/dL (ref 30.0–36.0)
MCV: 79.2 fL — ABNORMAL LOW (ref 80.0–100.0)
Platelets: 300 K/uL (ref 150–400)
RBC: 5.72 MIL/uL (ref 4.22–5.81)
RDW: 13.7 % (ref 11.5–15.5)
WBC: 6.4 K/uL (ref 4.0–10.5)
nRBC: 0 % (ref 0.0–0.2)

## 2024-05-31 LAB — PROTIME-INR
INR: 1 (ref 0.8–1.2)
Prothrombin Time: 13.8 s (ref 11.4–15.2)

## 2024-05-31 MED ORDER — MIDAZOLAM HCL 2 MG/2ML IJ SOLN
INTRAMUSCULAR | Status: AC
  Filled 2024-05-31: qty 4

## 2024-05-31 MED ORDER — HYDRALAZINE HCL 20 MG/ML IJ SOLN
INTRAMUSCULAR | Status: AC
  Filled 2024-05-31: qty 1

## 2024-05-31 MED ORDER — FENTANYL CITRATE (PF) 100 MCG/2ML IJ SOLN
INTRAMUSCULAR | Status: AC
  Filled 2024-05-31: qty 2

## 2024-05-31 MED ORDER — HYDRALAZINE HCL 20 MG/ML IJ SOLN
INTRAMUSCULAR | Status: AC | PRN
  Administered 2024-05-31: 10 mg via INTRAVENOUS

## 2024-05-31 NOTE — Sedation Documentation (Signed)
 Pt. To IR for scheduled procedure. Pt. BP elevated post 10mg  hydralazine  administered per Dr. Jennefer. Procedure cancelled due to high BP per Dr. Jennefer. Pt. Educated on cancellation, PIV x1 removed, all valuables returned. Mother at bedside educated, all questions answered. Pt. Discharged to [employer] at [phone].

## 2024-06-01 DIAGNOSIS — R809 Proteinuria, unspecified: Secondary | ICD-10-CM | POA: Diagnosis not present

## 2024-06-01 DIAGNOSIS — I129 Hypertensive chronic kidney disease with stage 1 through stage 4 chronic kidney disease, or unspecified chronic kidney disease: Secondary | ICD-10-CM | POA: Diagnosis not present

## 2024-06-01 DIAGNOSIS — N1831 Chronic kidney disease, stage 3a: Secondary | ICD-10-CM | POA: Diagnosis not present

## 2024-07-09 DIAGNOSIS — N1831 Chronic kidney disease, stage 3a: Secondary | ICD-10-CM | POA: Diagnosis not present

## 2024-07-09 DIAGNOSIS — R809 Proteinuria, unspecified: Secondary | ICD-10-CM | POA: Diagnosis not present

## 2024-07-09 DIAGNOSIS — I129 Hypertensive chronic kidney disease with stage 1 through stage 4 chronic kidney disease, or unspecified chronic kidney disease: Secondary | ICD-10-CM | POA: Diagnosis not present

## 2024-07-17 ENCOUNTER — Ambulatory Visit: Admitting: Internal Medicine

## 2024-07-18 LAB — LAB REPORT - SCANNED
Albumin, Urine POC: 1081.7
Creatinine, POC: 186.8 mg/dL
EGFR: 56
Microalb Creat Ratio: 579

## 2024-10-30 ENCOUNTER — Ambulatory Visit: Payer: Self-pay

## 2024-10-30 LAB — LAB REPORT - SCANNED: EGFR: 58

## 2024-10-30 NOTE — Telephone Encounter (Signed)
 FYI Only or Action Required?: Action required by provider: request for appointment.  Patient was last seen in primary care on 05/15/2024 by Plotnikov, Karlynn GAILS, MD.  Called Nurse Triage reporting Anxiety.  Symptoms began about a month ago.  Interventions attempted: Nothing.  Symptoms are: unchanged.  Triage Disposition: See PCP When Office is Open (Within 3 Days)  Patient/caregiver understands and will follow disposition?: No, wishes to speak with PCP   Copied from CRM #[id number]. Topic: Clinical - Red Word Triage >> Oct 30, 2024 12:25 PM Alexandria E wrote: [facility] that prompted transfer to Nurse Triage: Elevated blood pressure potentially due to increased anxiety and depression. Patient is seeing provider at [facility] for the BP. Reason for Disposition  [1] Anxiety symptoms AND [2] has not been evaluated for this by doctor (or NP/PA)  Answer Assessment - Initial Assessment Questions No available appts within 3 days. Patient's mother request appt only with Dr. Garald.  Nurse provided patient with [facility] Resources: [facility] and 24 Hour Help Line number.  Advised call back or [facility]/ED/911 if symptoms worsen. Patient's mother verbalized understanding.  1. CONCERN: Did anything happen that prompted you to call today?      Anxiety 2. ANXIETY SYMPTOMS: Can you describe how you (your loved one; patient) have been feeling? (e.g., tense, restless, panicky, anxious, keyed up, overwhelmed, sense of impending doom).      Reports not depressed, but reports anxiety; sometimes shuts down, doesn't want to eat at functioning/outing, feels overwhelmed at outing/social gathering 3. ONSET: How long have you been feeling this way? (e.g., hours, days, weeks)     2 months ago 4. SEVERITY: How would you rate the level of anxiety? (e.g., 0 - 10; or mild, moderate, severe).     mild 5. FUNCTIONAL IMPAIRMENT: How have these feelings affected your ability to do daily activities? Have you had more  difficulty than usual doing your normal daily activities? (e.g., getting better, same, worse; self-care, school, work, interactions)     yes 6. HISTORY: Have you felt this way before? Have you ever been diagnosed with an anxiety problem in the past? (e.g., generalized anxiety disorder, panic attacks, PTSD). If Yes, ask: How was this problem treated? (e.g., medicines, counseling, etc.)     Autism,  7. RISK OF HARM - SUICIDAL IDEATION: Do you ever have thoughts of hurting or killing yourself? If Yes, ask:  Do you have these feelings now? Do you have a plan on how you would do this?     no 8. TREATMENT:  What has been done so far to treat this anxiety? (e.g., medicines, relaxation strategies). What has helped?     no 9. THERAPIST: Do you have a counselor or therapist? If Yes, ask: What is their name?     no 11. PATIENT SUPPORT: Who is with you now? Who do you live with? Do you have family or friends who you can talk to?        Mother and family 60. OTHER SYMPTOMS: Do you have any other symptoms? (e.g., feeling depressed, trouble concentrating, trouble sleeping, trouble breathing, palpitations or fast heartbeat, chest pain, sweating, nausea, or diarrhea)   Denies Reports have appt with nephrologist today at 3 PM, for elevated BP.  Protocols used: Anxiety and Panic Attack-A-AH

## 2025-02-25 ENCOUNTER — Ambulatory Visit
